# Patient Record
Sex: Male | Born: 1990 | Race: Black or African American | Hispanic: No | Marital: Married | Smoking: Never smoker
Health system: Southern US, Community
[De-identification: ages and names within clinical notes are randomized; demographics above are authoritative.]

## PROBLEM LIST (undated history)

## (undated) HISTORY — PX: TONSILLECTOMY: SUR1361

---

## 2012-03-28 DIAGNOSIS — S31139A Puncture wound of abdominal wall without foreign body, unspecified quadrant without penetration into peritoneal cavity, initial encounter: Secondary | ICD-10-CM

## 2012-03-28 HISTORY — DX: Puncture wound of abdominal wall without foreign body, unspecified quadrant without penetration into peritoneal cavity, initial encounter: S31.139A

## 2012-03-28 HISTORY — PX: EXPLORATORY LAPAROTOMY: SUR591

## 2019-03-04 ENCOUNTER — Other Ambulatory Visit: Payer: Self-pay

## 2019-03-04 ENCOUNTER — Emergency Department (HOSPITAL_BASED_OUTPATIENT_CLINIC_OR_DEPARTMENT_OTHER): Payer: Worker's Compensation

## 2019-03-04 ENCOUNTER — Encounter (HOSPITAL_BASED_OUTPATIENT_CLINIC_OR_DEPARTMENT_OTHER): Payer: Self-pay

## 2019-03-04 ENCOUNTER — Emergency Department (HOSPITAL_BASED_OUTPATIENT_CLINIC_OR_DEPARTMENT_OTHER)
Admission: EM | Admit: 2019-03-04 | Discharge: 2019-03-04 | Disposition: A | Discharge status: Home / Self Care | Payer: Worker's Compensation | Attending: Emergency Medicine | Admitting: Emergency Medicine

## 2019-03-04 DIAGNOSIS — Y92410 Unspecified street and highway as the place of occurrence of the external cause: Secondary | ICD-10-CM | POA: Diagnosis not present

## 2019-03-04 DIAGNOSIS — M542 Cervicalgia: Secondary | ICD-10-CM | POA: Insufficient documentation

## 2019-03-04 DIAGNOSIS — M549 Dorsalgia, unspecified: Secondary | ICD-10-CM | POA: Diagnosis not present

## 2019-03-04 DIAGNOSIS — G44309 Post-traumatic headache, unspecified, not intractable: Secondary | ICD-10-CM | POA: Insufficient documentation

## 2019-03-04 DIAGNOSIS — Y9389 Activity, other specified: Secondary | ICD-10-CM | POA: Diagnosis not present

## 2019-03-04 DIAGNOSIS — Y990 Civilian activity done for income or pay: Secondary | ICD-10-CM | POA: Diagnosis not present

## 2019-03-04 MED ORDER — OXYCODONE-ACETAMINOPHEN 5-325 MG PO TABS
1.0000 | ORAL_TABLET | Freq: Once | ORAL | Status: AC
  Administered 2019-03-04: 1 via ORAL
  Filled 2019-03-04: qty 1

## 2019-03-04 MED ORDER — NAPROXEN 500 MG PO TABS: 500.0000 mg | ORAL_TABLET | Freq: Two times a day (BID) | ORAL | 0 refills | Status: DC

## 2019-03-04 MED ORDER — CYCLOBENZAPRINE HCL 5 MG PO TABS: 5.0000 mg | ORAL_TABLET | Freq: Two times a day (BID) | ORAL | 0 refills | Status: DC | PRN

## 2019-03-04 MED ORDER — CYCLOBENZAPRINE HCL 10 MG PO TABS
10.0000 mg | ORAL_TABLET | Freq: Once | ORAL | Status: AC
  Administered 2019-03-04: 11:00:00 10 mg via ORAL
  Filled 2019-03-04: qty 1

## 2019-03-04 MED FILL — CYCLOBENZAPRINE HCL 5 MG TA: 5 | 15 days supply | Qty: 30 | Fill #0

## 2019-03-04 NOTE — ED Notes (Signed)
Patient transported to CT 

## 2019-03-04 NOTE — Discharge Instructions (Addendum)
Recommend taking prescribed NSAID, muscle relaxer and Tylenol for pain control.  Return to ER if you develop any numbness, weakness, vision changes, other new concerning symptom.  Otherwise recommend recheck with your primary doctor later this week.  Please note the muscle relaxer, Flexeril, can make you drowsy and should not be taken while driving or operate heavy machinery.
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
## 2019-03-04 NOTE — ED Triage Notes (Signed)
Pt states that he was in [facility] Saturday, has pain with turning his head, c/o back pain and trouble lifting.

## 2019-03-07 ENCOUNTER — Ambulatory Visit: Payer: Self-pay | Admitting: Adult Health Nurse Practitioner

## 2019-03-13 ENCOUNTER — Encounter: Payer: Self-pay | Admitting: Adult Health Nurse Practitioner

## 2019-03-13 ENCOUNTER — Ambulatory Visit (INDEPENDENT_AMBULATORY_CARE_PROVIDER_SITE_OTHER): Payer: Managed Care, Other (non HMO) | Admitting: Adult Health Nurse Practitioner

## 2019-03-13 ENCOUNTER — Other Ambulatory Visit: Payer: Self-pay

## 2019-03-13 DIAGNOSIS — S060X0A Concussion without loss of consciousness, initial encounter: Secondary | ICD-10-CM | POA: Diagnosis not present

## 2019-03-13 DIAGNOSIS — S134XXA Sprain of ligaments of cervical spine, initial encounter: Secondary | ICD-10-CM

## 2019-03-13 DIAGNOSIS — S134XXS Sprain of ligaments of cervical spine, sequela: Secondary | ICD-10-CM

## 2019-03-13 DIAGNOSIS — S161XXA Strain of muscle, fascia and tendon at neck level, initial encounter: Secondary | ICD-10-CM

## 2019-03-13 DIAGNOSIS — S161XXS Strain of muscle, fascia and tendon at neck level, sequela: Secondary | ICD-10-CM

## 2019-03-13 DIAGNOSIS — S060X0S Concussion without loss of consciousness, sequela: Secondary | ICD-10-CM

## 2019-03-13 HISTORY — DX: Sprain of ligaments of cervical spine, sequela: S13.4XXS

## 2019-03-13 HISTORY — DX: Strain of muscle, fascia and tendon at neck level, sequela: S16.1XXS

## 2019-03-13 HISTORY — DX: Concussion without loss of consciousness, initial encounter: S06.0X0A

## 2019-03-13 MED ORDER — TIZANIDINE HCL 4 MG PO TABS: 4.0000 mg | ORAL_TABLET | Freq: Four times a day (QID) | ORAL | 0 refills | Status: DC | PRN

## 2019-03-13 MED ORDER — METHYLPREDNISOLONE 4 MG PO TBPK: ORAL_TABLET | ORAL | 0 refills | Status: DC

## 2019-03-13 NOTE — Patient Instructions (Signed)
° ° ° °  If you have lab work done today you will be contacted with your lab results within the next 2 weeks.  If you have not heard from us then please contact us. The fastest way to get your results is to register for My Chart. ° ° °IF you received an x-ray today, you will receive an invoice from [facility]. Please contact [facility] at [phone] with questions or concerns regarding your invoice.  ° °IF you received labwork today, you will receive an invoice from [facility]. Please contact [facility] at [phone] with questions or concerns regarding your invoice.  ° °Our billing staff will not be able to assist you with questions regarding bills from these companies. ° °You will be contacted with the lab results as soon as they are available. The fastest way to get your results is to activate your My Chart account. Instructions are located on the last page of this paperwork. If you have not heard from us regarding the results in 2 weeks, please contact this office. °  ° ° ° °
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
## 2019-04-02 ENCOUNTER — Ambulatory Visit: Payer: Managed Care, Other (non HMO) | Attending: Adult Health Nurse Practitioner | Admitting: Physical Therapy

## 2019-09-01 ENCOUNTER — Other Ambulatory Visit: Payer: Self-pay

## 2019-09-01 ENCOUNTER — Encounter (HOSPITAL_BASED_OUTPATIENT_CLINIC_OR_DEPARTMENT_OTHER): Payer: Self-pay | Admitting: Emergency Medicine

## 2019-09-01 ENCOUNTER — Emergency Department (HOSPITAL_BASED_OUTPATIENT_CLINIC_OR_DEPARTMENT_OTHER): Payer: 59

## 2019-09-01 ENCOUNTER — Emergency Department (HOSPITAL_BASED_OUTPATIENT_CLINIC_OR_DEPARTMENT_OTHER)
Admission: EM | Admit: 2019-09-01 | Discharge: 2019-09-01 | Disposition: A | Discharge status: Home / Self Care | Payer: 59 | Attending: Emergency Medicine | Admitting: Emergency Medicine

## 2019-09-01 DIAGNOSIS — Z79899 Other long term (current) drug therapy: Secondary | ICD-10-CM | POA: Insufficient documentation

## 2019-09-01 DIAGNOSIS — R1084 Generalized abdominal pain: Secondary | ICD-10-CM | POA: Insufficient documentation

## 2019-09-01 DIAGNOSIS — R112 Nausea with vomiting, unspecified: Secondary | ICD-10-CM | POA: Insufficient documentation

## 2019-09-01 DIAGNOSIS — R197 Diarrhea, unspecified: Secondary | ICD-10-CM | POA: Insufficient documentation

## 2019-09-01 LAB — COMPREHENSIVE METABOLIC PANEL
ALT: 29 U/L (ref 0–44)
AST: 29 U/L (ref 15–41)
Albumin: 4.6 g/dL (ref 3.5–5.0)
Alkaline Phosphatase: 75 U/L (ref 38–126)
Anion gap: 11 (ref 5–15)
BUN: 13 mg/dL (ref 6–20)
CO2: 25 mmol/L (ref 22–32)
Calcium: 9 mg/dL (ref 8.9–10.3)
Chloride: 101 mmol/L (ref 98–111)
Creatinine, Ser: 0.95 mg/dL (ref 0.61–1.24)
GFR calc Af Amer: >60 mL/min (ref >60)
GFR calc non Af Amer: >60 mL/min (ref >60)
Glucose, Bld: 105 mg/dL — ABNORMAL HIGH (ref 70–99)
Potassium: 3.6 mmol/L (ref 3.5–5.1)
Sodium: 137 mmol/L (ref 135–145)
Total Bilirubin: 1.6 mg/dL — ABNORMAL HIGH (ref 0.3–1.2)
Total Protein: 8.7 g/dL — ABNORMAL HIGH (ref 6.5–8.1)

## 2019-09-01 LAB — CBC WITH DIFFERENTIAL/PLATELET
Abs Immature Granulocytes: 0.06 10*3/uL (ref 0.00–0.07)
Basophils Absolute: 0 10*3/uL (ref 0.0–0.1)
Basophils Relative: 0 %
Eosinophils Absolute: 0.2 10*3/uL (ref 0.0–0.5)
Eosinophils Relative: 1 %
HCT: 48.2 % (ref 39.0–52.0)
Hemoglobin: 15.7 g/dL (ref 13.0–17.0)
Immature Granulocytes: 0 %
Lymphocytes Relative: 29 %
Lymphs Abs: 4.2 10*3/uL — ABNORMAL HIGH (ref 0.7–4.0)
MCH: 26.5 pg (ref 26.0–34.0)
MCHC: 32.6 g/dL (ref 30.0–36.0)
MCV: 81.4 fL (ref 80.0–100.0)
Monocytes Absolute: 1.2 10*3/uL — ABNORMAL HIGH (ref 0.1–1.0)
Monocytes Relative: 8 %
Neutro Abs: 8.9 10*3/uL — ABNORMAL HIGH (ref 1.7–7.7)
Neutrophils Relative %: 62 %
Platelets: 399 10*3/uL (ref 150–400)
RBC: 5.92 MIL/uL — ABNORMAL HIGH (ref 4.22–5.81)
RDW: 13.3 % (ref 11.5–15.5)
WBC: 14.7 10*3/uL — ABNORMAL HIGH (ref 4.0–10.5)
nRBC: 0 % (ref 0.0–0.2)

## 2019-09-01 LAB — URINALYSIS, ROUTINE W REFLEX MICROSCOPIC
Glucose, UA: NEGATIVE mg/dL
Ketones, ur: 15 mg/dL — AB
Leukocytes,Ua: NEGATIVE
Nitrite: NEGATIVE
Protein, ur: NEGATIVE mg/dL
Specific Gravity, Urine: 1.02 (ref 1.005–1.030)
pH: 5.5 (ref 5.0–8.0)

## 2019-09-01 LAB — URINALYSIS, MICROSCOPIC (REFLEX)

## 2019-09-01 LAB — LIPASE, BLOOD: Lipase: 19 U/L (ref 11–51)

## 2019-09-01 MED ORDER — SODIUM CHLORIDE 0.9 % IV BOLUS
1000.0000 mL | Freq: Once | INTRAVENOUS | Status: AC
  Administered 2019-09-01: 1000 mL via INTRAVENOUS

## 2019-09-01 MED ORDER — ONDANSETRON HCL 4 MG/2ML IJ SOLN
4.0000 mg | Freq: Once | INTRAMUSCULAR | Status: AC
  Administered 2019-09-01: 4 mg via INTRAVENOUS
  Filled 2019-09-01: qty 2

## 2019-09-01 MED ORDER — DICYCLOMINE HCL 20 MG PO TABS: 20.0000 mg | ORAL_TABLET | Freq: Two times a day (BID) | ORAL | 0 refills | Status: DC

## 2019-09-01 MED ORDER — MORPHINE SULFATE (PF) 4 MG/ML IV SOLN
4.0000 mg | Freq: Once | INTRAVENOUS | Status: AC
  Administered 2019-09-01: 4 mg via INTRAVENOUS
  Filled 2019-09-01: qty 1

## 2019-09-01 MED ORDER — DICYCLOMINE HCL 10 MG PO CAPS
10.0000 mg | ORAL_CAPSULE | Freq: Once | ORAL | Status: AC
  Administered 2019-09-01: 10 mg via ORAL
  Filled 2019-09-01: qty 1

## 2019-09-01 MED ORDER — IOHEXOL 300 MG/ML  SOLN
100.0000 mL | Freq: Once | INTRAMUSCULAR | Status: AC | PRN
  Administered 2019-09-01: 100 mL via INTRAVENOUS

## 2019-09-01 MED ORDER — FENTANYL CITRATE (PF) 100 MCG/2ML IJ SOLN
100.0000 ug | Freq: Once | INTRAMUSCULAR | Status: AC
  Administered 2019-09-01: 100 ug via INTRAVENOUS
  Filled 2019-09-01: qty 2

## 2019-09-01 MED ORDER — ONDANSETRON 4 MG PO TBDP: 4.0000 mg | ORAL_TABLET | Freq: Three times a day (TID) | ORAL | 0 refills | Status: DC | PRN

## 2019-09-01 NOTE — Discharge Instructions (Signed)
Take Bentyl as directed.  Take Zofran for nausea.  Make sure you are staying hydrated drink plenty of fluids.  Return to the emergency department for any worsening abdominal pain, fevers, persistent vomiting, persistent blood in your vomit or any other worsening or concerning symptoms.
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
## 2019-09-01 NOTE — ED Notes (Signed)
Pt tolerated sips of water, no emesis.

## 2019-09-01 NOTE — ED Triage Notes (Signed)
Vomiting since Friday.

## 2019-09-01 NOTE — ED Notes (Signed)
PT to CT scan

## 2019-09-06 ENCOUNTER — Other Ambulatory Visit: Payer: Self-pay

## 2019-09-06 ENCOUNTER — Emergency Department (HOSPITAL_BASED_OUTPATIENT_CLINIC_OR_DEPARTMENT_OTHER)
Admission: EM | Admit: 2019-09-06 | Discharge: 2019-09-07 | Disposition: A | Discharge status: Home / Self Care | Payer: Medicaid Other | Attending: Emergency Medicine | Admitting: Emergency Medicine

## 2019-09-06 ENCOUNTER — Encounter (HOSPITAL_BASED_OUTPATIENT_CLINIC_OR_DEPARTMENT_OTHER): Payer: Self-pay

## 2019-09-06 DIAGNOSIS — R1031 Right lower quadrant pain: Secondary | ICD-10-CM | POA: Insufficient documentation

## 2019-09-06 DIAGNOSIS — R197 Diarrhea, unspecified: Secondary | ICD-10-CM | POA: Insufficient documentation

## 2019-09-06 DIAGNOSIS — R112 Nausea with vomiting, unspecified: Secondary | ICD-10-CM

## 2019-09-06 NOTE — ED Triage Notes (Addendum)
Pt c/o vomiting and diarrhea after eating at [facility]. Pt was seen on 6/6/ for same. Pt was Rx Bentyl and Zofran, but pt has had no relief today. Emesis 7x in last 24 hrs. Pt actively vomiting during triage.

## 2019-09-07 ENCOUNTER — Emergency Department (HOSPITAL_BASED_OUTPATIENT_CLINIC_OR_DEPARTMENT_OTHER): Payer: Medicaid Other

## 2019-09-07 LAB — URINALYSIS, ROUTINE W REFLEX MICROSCOPIC
Bilirubin Urine: NEGATIVE
Glucose, UA: NEGATIVE mg/dL
Ketones, ur: NEGATIVE mg/dL
Leukocytes,Ua: NEGATIVE
Nitrite: NEGATIVE
Protein, ur: NEGATIVE mg/dL
Specific Gravity, Urine: 1.02 (ref 1.005–1.030)
pH: 7 (ref 5.0–8.0)

## 2019-09-07 LAB — CBC WITH DIFFERENTIAL/PLATELET
Abs Immature Granulocytes: 0.04 10*3/uL (ref 0.00–0.07)
Basophils Absolute: 0 10*3/uL (ref 0.0–0.1)
Basophils Relative: 0 %
Eosinophils Absolute: 0.3 10*3/uL (ref 0.0–0.5)
Eosinophils Relative: 2 %
HCT: 43.6 % (ref 39.0–52.0)
Hemoglobin: 14 g/dL (ref 13.0–17.0)
Immature Granulocytes: 0 %
Lymphocytes Relative: 26 %
Lymphs Abs: 3.3 10*3/uL (ref 0.7–4.0)
MCH: 26.2 pg (ref 26.0–34.0)
MCHC: 32.1 g/dL (ref 30.0–36.0)
MCV: 81.6 fL (ref 80.0–100.0)
Monocytes Absolute: 0.7 10*3/uL (ref 0.1–1.0)
Monocytes Relative: 6 %
Neutro Abs: 8 10*3/uL — ABNORMAL HIGH (ref 1.7–7.7)
Neutrophils Relative %: 66 %
Platelets: 380 10*3/uL (ref 150–400)
RBC: 5.34 MIL/uL (ref 4.22–5.81)
RDW: 13.2 % (ref 11.5–15.5)
WBC: 12.4 10*3/uL — ABNORMAL HIGH (ref 4.0–10.5)
nRBC: 0 % (ref 0.0–0.2)

## 2019-09-07 LAB — RAPID URINE DRUG SCREEN, HOSP PERFORMED
Amphetamines: NOT DETECTED
Barbiturates: NOT DETECTED
Benzodiazepines: NOT DETECTED
Cocaine: NOT DETECTED
Opiates: NOT DETECTED
Tetrahydrocannabinol: POSITIVE — AB

## 2019-09-07 LAB — COMPREHENSIVE METABOLIC PANEL
ALT: 22 U/L (ref 0–44)
AST: 21 U/L (ref 15–41)
Albumin: 4.2 g/dL (ref 3.5–5.0)
Alkaline Phosphatase: 67 U/L (ref 38–126)
Anion gap: 10 (ref 5–15)
BUN: 12 mg/dL (ref 6–20)
CO2: 27 mmol/L (ref 22–32)
Calcium: 9 mg/dL (ref 8.9–10.3)
Chloride: 101 mmol/L (ref 98–111)
Creatinine, Ser: 1.06 mg/dL (ref 0.61–1.24)
GFR calc Af Amer: >60 mL/min (ref >60)
GFR calc non Af Amer: >60 mL/min (ref >60)
Glucose, Bld: 110 mg/dL — ABNORMAL HIGH (ref 70–99)
Potassium: 3.8 mmol/L (ref 3.5–5.1)
Sodium: 138 mmol/L (ref 135–145)
Total Bilirubin: 0.7 mg/dL (ref 0.3–1.2)
Total Protein: 7.5 g/dL (ref 6.5–8.1)

## 2019-09-07 LAB — URINALYSIS, MICROSCOPIC (REFLEX)

## 2019-09-07 LAB — LIPASE, BLOOD: Lipase: 21 U/L (ref 11–51)

## 2019-09-07 MED ORDER — PROMETHAZINE HCL 25 MG PO TABS
25.0000 mg | ORAL_TABLET | Freq: Four times a day (QID) | ORAL | 0 refills | Status: DC | PRN
Start: 2019-09-07 — End: 2021-10-03

## 2019-09-07 MED ORDER — KETOROLAC TROMETHAMINE 30 MG/ML IJ SOLN
30.0000 mg | Freq: Once | INTRAMUSCULAR | Status: AC
  Administered 2019-09-07: 30 mg via INTRAVENOUS
  Filled 2019-09-07: qty 1

## 2019-09-07 MED ORDER — SODIUM CHLORIDE 0.9 % IV BOLUS
1000.0000 mL | Freq: Once | INTRAVENOUS | Status: AC
  Administered 2019-09-07: 1000 mL via INTRAVENOUS

## 2019-09-07 MED ORDER — HALOPERIDOL LACTATE 5 MG/ML IJ SOLN
5.0000 mg | Freq: Once | INTRAMUSCULAR | Status: AC
  Administered 2019-09-07: 5 mg via INTRAVENOUS
  Filled 2019-09-07: qty 1

## 2019-09-07 MED ORDER — IOHEXOL 300 MG/ML  SOLN
100.0000 mL | Freq: Once | INTRAMUSCULAR | Status: AC | PRN
  Administered 2019-09-07: 100 mL via INTRAVENOUS

## 2019-09-07 MED ORDER — ONDANSETRON HCL 4 MG/2ML IJ SOLN
4.0000 mg | Freq: Once | INTRAMUSCULAR | Status: DC
  Filled 2019-09-07: qty 2

## 2019-09-07 NOTE — ED Notes (Signed)
  Patient given urinal and instructed to obtain urine specimen when he feels able.
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
## 2019-09-07 NOTE — Discharge Instructions (Addendum)
Begin taking Phenergan as prescribed as needed for nausea.  Clear liquid diet for the next 12 hours, then slowly advance to normal as tolerated.  Refrain from marijuana use.  Return to the ER for worsening pain, high fever, bloody stools, or other new and concerning symptoms.

## 2019-10-03 ENCOUNTER — Encounter (HOSPITAL_COMMUNITY): Payer: Self-pay

## 2019-10-03 ENCOUNTER — Other Ambulatory Visit: Payer: Self-pay

## 2019-10-03 ENCOUNTER — Emergency Department (HOSPITAL_COMMUNITY)
Admission: EM | Admit: 2019-10-03 | Discharge: 2019-10-03 | Disposition: A | Discharge status: Home / Self Care | Payer: Medicaid Other | Attending: Emergency Medicine | Admitting: Emergency Medicine

## 2019-10-03 DIAGNOSIS — R1110 Vomiting, unspecified: Secondary | ICD-10-CM | POA: Insufficient documentation

## 2019-10-03 DIAGNOSIS — Z5321 Procedure and treatment not carried out due to patient leaving prior to being seen by health care provider: Secondary | ICD-10-CM | POA: Insufficient documentation

## 2019-10-03 DIAGNOSIS — R197 Diarrhea, unspecified: Secondary | ICD-10-CM | POA: Insufficient documentation

## 2019-10-03 DIAGNOSIS — R1084 Generalized abdominal pain: Secondary | ICD-10-CM | POA: Insufficient documentation

## 2019-10-03 LAB — CBC
HCT: 43.3 % (ref 39.0–52.0)
Hemoglobin: 13.7 g/dL (ref 13.0–17.0)
MCH: 26.3 pg (ref 26.0–34.0)
MCHC: 31.6 g/dL (ref 30.0–36.0)
MCV: 83.3 fL (ref 80.0–100.0)
Platelets: 372 10*3/uL (ref 150–400)
RBC: 5.2 MIL/uL (ref 4.22–5.81)
RDW: 13.2 % (ref 11.5–15.5)
WBC: 9.7 10*3/uL (ref 4.0–10.5)
nRBC: 0 % (ref 0.0–0.2)

## 2019-10-03 LAB — COMPREHENSIVE METABOLIC PANEL WITH GFR
ALT: 27 U/L (ref 0–44)
AST: 22 U/L (ref 15–41)
Albumin: 3.9 g/dL (ref 3.5–5.0)
Alkaline Phosphatase: 71 U/L (ref 38–126)
Anion gap: 9 (ref 5–15)
BUN: 11 mg/dL (ref 6–20)
CO2: 24 mmol/L (ref 22–32)
Calcium: 9 mg/dL (ref 8.9–10.3)
Chloride: 106 mmol/L (ref 98–111)
Creatinine, Ser: 0.96 mg/dL (ref 0.61–1.24)
GFR calc Af Amer: >60 mL/min
GFR calc non Af Amer: >60 mL/min
Glucose, Bld: 136 mg/dL — ABNORMAL HIGH (ref 70–99)
Potassium: 3.8 mmol/L (ref 3.5–5.1)
Sodium: 139 mmol/L (ref 135–145)
Total Bilirubin: 0.8 mg/dL (ref 0.3–1.2)
Total Protein: 6.9 g/dL (ref 6.5–8.1)

## 2019-10-03 LAB — LIPASE, BLOOD: Lipase: 41 U/L (ref 11–51)

## 2019-10-03 MED ORDER — SODIUM CHLORIDE 0.9% FLUSH: 3.0000 mL | Freq: Once | INTRAVENOUS | Status: DC

## 2019-10-03 NOTE — ED Notes (Signed)
Called x 2 NO answer 

## 2019-10-03 NOTE — ED Triage Notes (Signed)
Patient complains of ongoing abdominal cramping with emesis and small amount of diarrhea x 2 weeks. States that he ate bad chicken. Alert and oriented, aNAD

## 2019-10-03 NOTE — ED Notes (Signed)
Called 3x for repeat vitals with no response

## 2020-11-26 IMAGING — CT CT ABD-PELV W/ CM
2 of 4 series · 17 of 46 positions shown, 19 images · IV contrast (Omnipaque)
Comparison: September 01, 2019

CLINICAL DATA: Abdominal pain

EXAM:
CT ABDOMEN AND PELVIS WITH CONTRAST
TECHNIQUE: Multidetector CT imaging of the abdomen and pelvis was performed
using the standard protocol following bolus administration of
intravenous contrast.
CONTRAST:  100mL OMNIPAQUE IOHEXOL 300 MG/ML  SOLN

[Series 2: axial st · axial · 0.88mm/px · z∈[+763,+1183]mm · 14 of 92 slices shown, 16 images]
[im 4/92  soft-tissue]
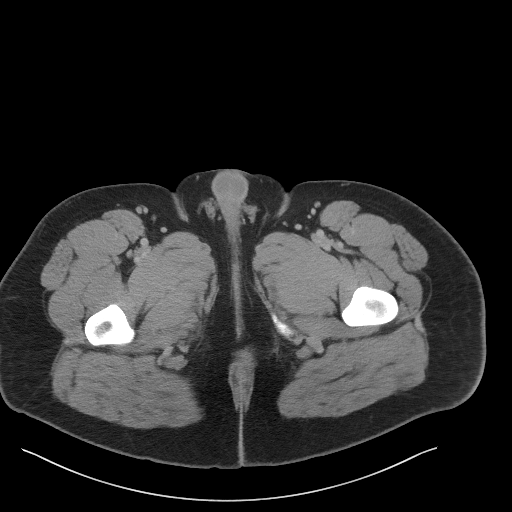
[im 4/92  bone]
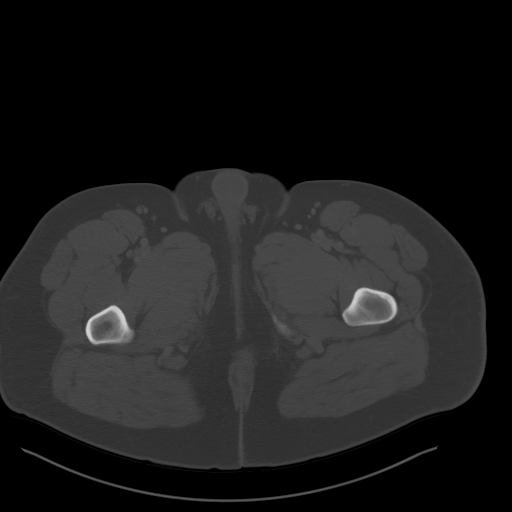
[im 12/92  soft-tissue]
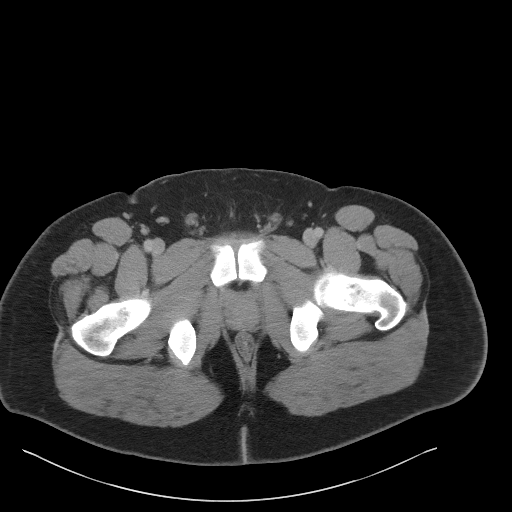
[im 19/92  soft-tissue]
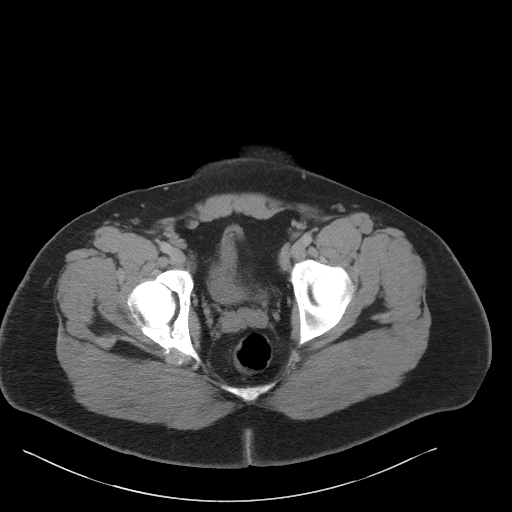
[im 23/92  soft-tissue]
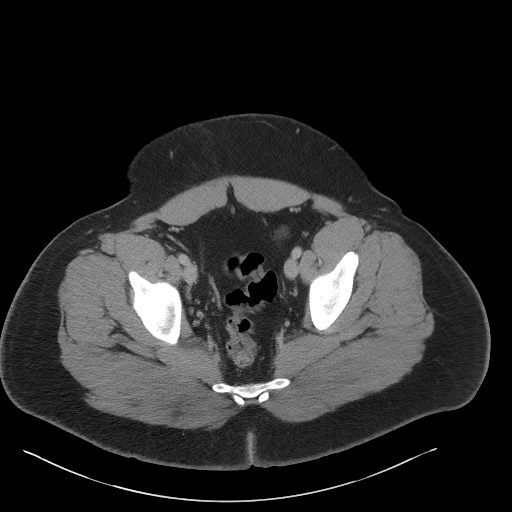
[im 31/92  soft-tissue]
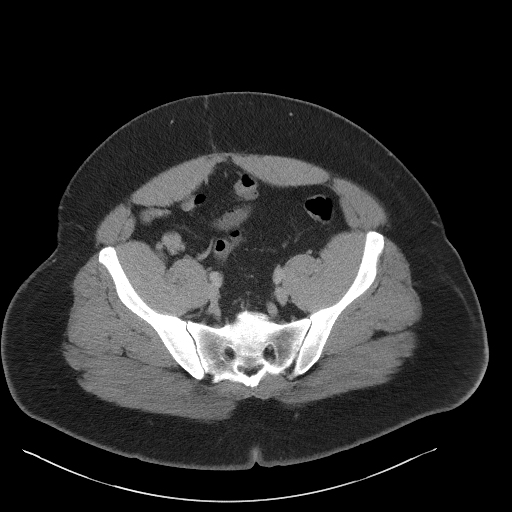
[im 38/92  soft-tissue]
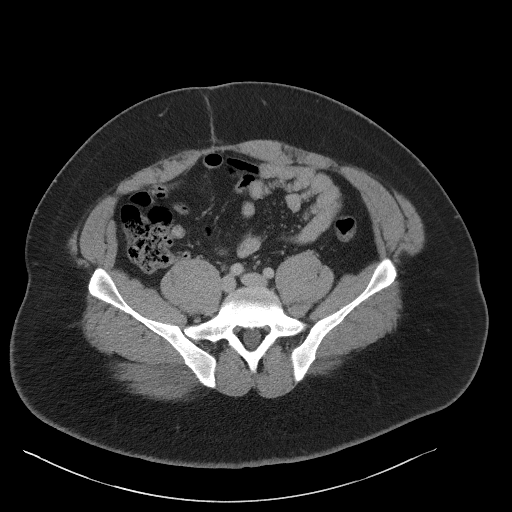
[im 42/92  soft-tissue]
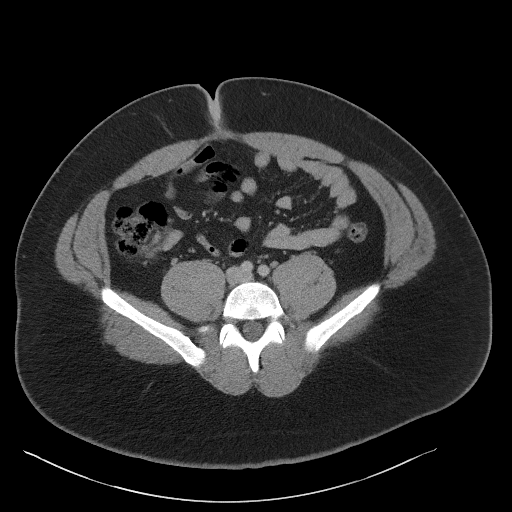
[im 50/92  soft-tissue]
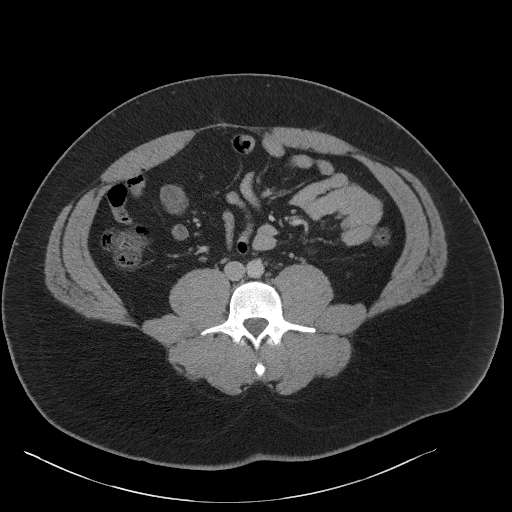
[im 54/92  soft-tissue]
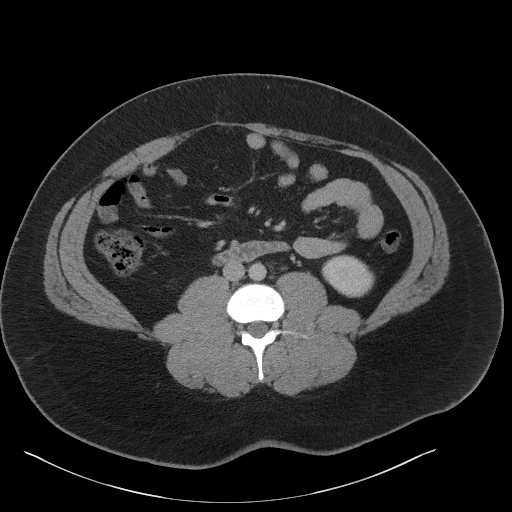
[im 54/92  bone]
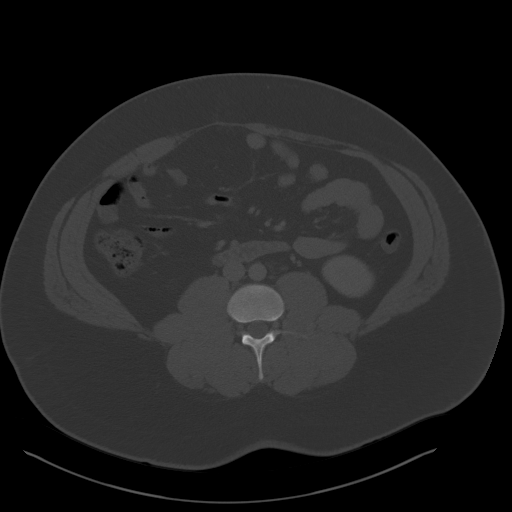
[im 61/92  soft-tissue]
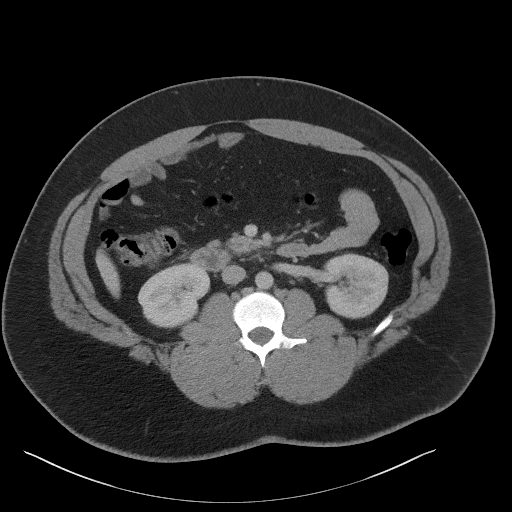
[im 69/92  soft-tissue]
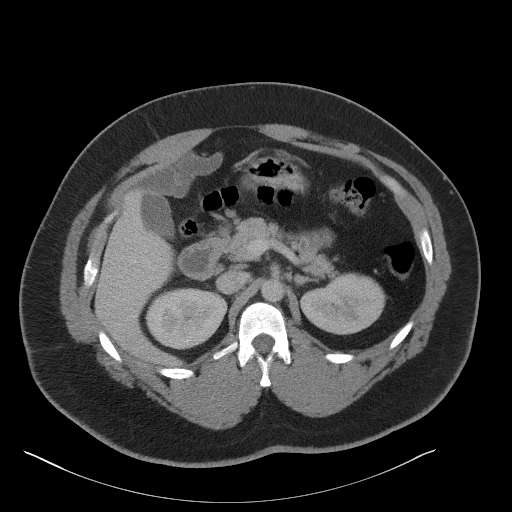
[im 73/92  soft-tissue]
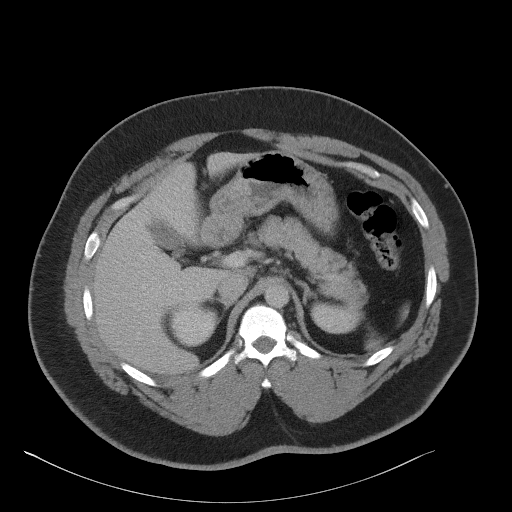
[im 80/92  soft-tissue]
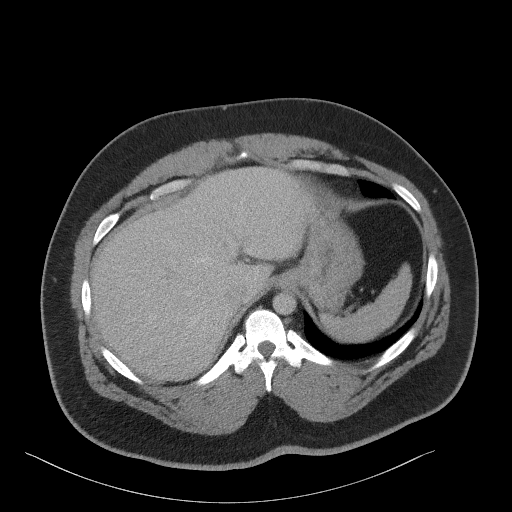
[im 88/92  soft-tissue]
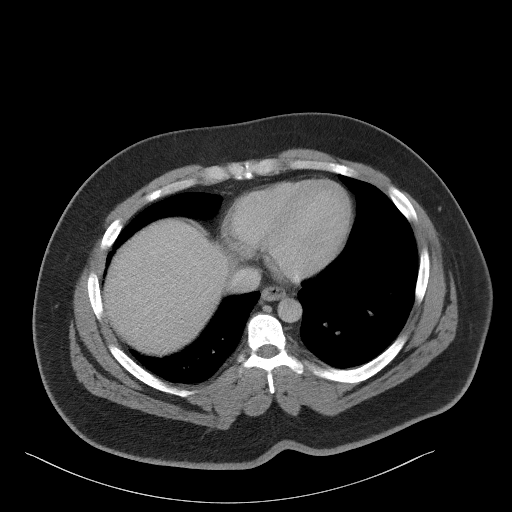

[Series 5: coronal st · coronal · 0.90mm/px · 3 of 101 slices shown]
[im 34/101  soft-tissue]
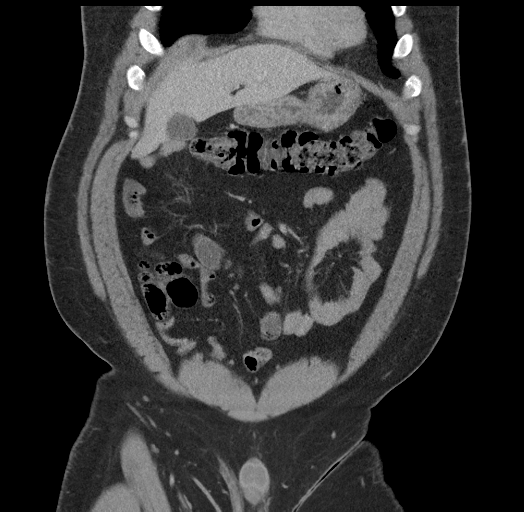
[im 45/101  soft-tissue]
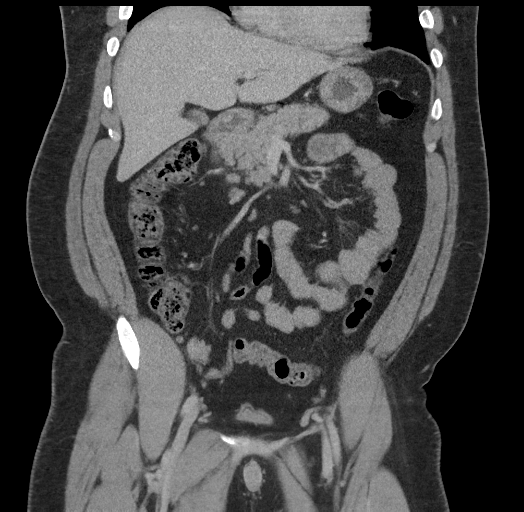
[im 56/101  soft-tissue]
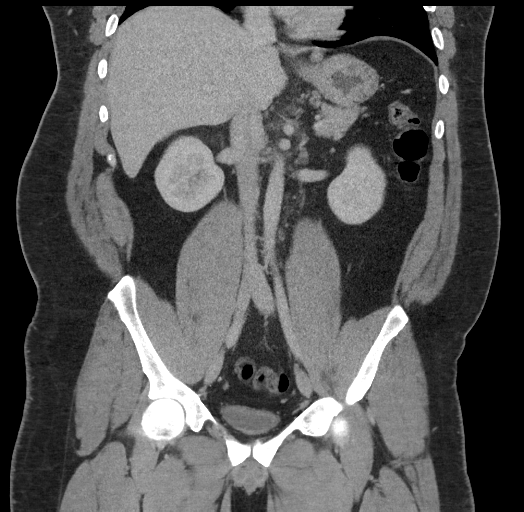

[17 of 46 positions shown; findings below may reference images not displayed]

FINDINGS: Lower chest: The lung bases are clear. The heart size is normal.

Hepatobiliary: The liver is normal. Normal gallbladder.There is no
biliary ductal dilation.

Pancreas: Normal contours without ductal dilatation. No
peripancreatic fluid collection.

Spleen: Unremarkable.

Adrenals/Urinary Tract:

--Adrenal glands: Unremarkable.

--Right kidney/ureter: No hydronephrosis or radiopaque kidney
stones.

--Left kidney/ureter: No hydronephrosis or radiopaque kidney stones.

--Urinary bladder: Unremarkable.

Stomach/Bowel:

--Stomach/Duodenum: No hiatal hernia or other gastric abnormality.
Normal duodenal course and caliber.

--Small bowel: Unremarkable.

--Colon: Unremarkable.

--Appendix: Normal.

Vascular/Lymphatic: Normal course and caliber of the major abdominal
vessels.

--No retroperitoneal lymphadenopathy.

--No mesenteric lymphadenopathy.

--No pelvic or inguinal lymphadenopathy.

Reproductive: Unremarkable

Other: No ascites or free air. The abdominal wall is normal.

Musculoskeletal. No acute displaced fractures. There may be early
mild avascular necrosis of the right femoral head. Alternatively,
this finding may be secondary to trauma as there appears to be a
defect in the posterior wall of the right acetabulum.
IMPRESSION: No acute abnormality.

## 2021-10-02 ENCOUNTER — Other Ambulatory Visit: Payer: Self-pay

## 2021-10-02 ENCOUNTER — Encounter (HOSPITAL_BASED_OUTPATIENT_CLINIC_OR_DEPARTMENT_OTHER): Payer: Self-pay | Admitting: Emergency Medicine

## 2021-10-02 ENCOUNTER — Emergency Department (HOSPITAL_BASED_OUTPATIENT_CLINIC_OR_DEPARTMENT_OTHER)
Admission: EM | Admit: 2021-10-02 | Discharge: 2021-10-03 | Disposition: A | Discharge status: Home / Self Care | Payer: Self-pay | Attending: Emergency Medicine | Admitting: Emergency Medicine

## 2021-10-02 DIAGNOSIS — R059 Cough, unspecified: Secondary | ICD-10-CM | POA: Insufficient documentation

## 2021-10-02 DIAGNOSIS — K529 Noninfective gastroenteritis and colitis, unspecified: Secondary | ICD-10-CM | POA: Insufficient documentation

## 2021-10-02 DIAGNOSIS — K8020 Calculus of gallbladder without cholecystitis without obstruction: Secondary | ICD-10-CM | POA: Insufficient documentation

## 2021-10-02 LAB — COMPREHENSIVE METABOLIC PANEL
ALT: 22 U/L (ref 0–44)
AST: 20 U/L (ref 15–41)
Albumin: 3.9 g/dL (ref 3.5–5.0)
Alkaline Phosphatase: 72 U/L (ref 38–126)
Anion gap: 8 (ref 5–15)
BUN: 8 mg/dL (ref 6–20)
CO2: 26 mmol/L (ref 22–32)
Calcium: 9 mg/dL (ref 8.9–10.3)
Chloride: 103 mmol/L (ref 98–111)
Creatinine, Ser: 0.92 mg/dL (ref 0.61–1.24)
GFR, Estimated: >60 mL/min (ref >60)
Glucose, Bld: 111 mg/dL — ABNORMAL HIGH (ref 70–99)
Potassium: 3.4 mmol/L — ABNORMAL LOW (ref 3.5–5.1)
Sodium: 137 mmol/L (ref 135–145)
Total Bilirubin: 1.2 mg/dL (ref 0.3–1.2)
Total Protein: 8.1 g/dL (ref 6.5–8.1)

## 2021-10-02 LAB — URINALYSIS, ROUTINE W REFLEX MICROSCOPIC
Bilirubin Urine: NEGATIVE
Glucose, UA: NEGATIVE mg/dL
Hgb urine dipstick: NEGATIVE
Ketones, ur: NEGATIVE mg/dL
Leukocytes,Ua: NEGATIVE
Nitrite: NEGATIVE
Protein, ur: NEGATIVE mg/dL
Specific Gravity, Urine: 1.01 (ref 1.005–1.030)
pH: 7 (ref 5.0–8.0)

## 2021-10-02 LAB — CBC
HCT: 43.2 % (ref 39.0–52.0)
Hemoglobin: 14.6 g/dL (ref 13.0–17.0)
MCH: 26.9 pg (ref 26.0–34.0)
MCHC: 33.8 g/dL (ref 30.0–36.0)
MCV: 79.7 fL — ABNORMAL LOW (ref 80.0–100.0)
Platelets: 393 10*3/uL (ref 150–400)
RBC: 5.42 MIL/uL (ref 4.22–5.81)
RDW: 13.1 % (ref 11.5–15.5)
WBC: 13.9 10*3/uL — ABNORMAL HIGH (ref 4.0–10.5)
nRBC: 0 % (ref 0.0–0.2)

## 2021-10-02 LAB — LIPASE, BLOOD: Lipase: 22 U/L (ref 11–51)

## 2021-10-02 NOTE — ED Triage Notes (Signed)
Reports generalized abdominal pain, cough, n/v/d for the last few days.  Taking otc cold medicine at home.

## 2021-10-03 ENCOUNTER — Emergency Department (HOSPITAL_BASED_OUTPATIENT_CLINIC_OR_DEPARTMENT_OTHER): Payer: Self-pay

## 2021-10-03 ENCOUNTER — Encounter (HOSPITAL_BASED_OUTPATIENT_CLINIC_OR_DEPARTMENT_OTHER): Payer: Self-pay | Admitting: Emergency Medicine

## 2021-10-03 LAB — DIFFERENTIAL
Abs Immature Granulocytes: 0.05 10*3/uL (ref 0.00–0.07)
Basophils Absolute: 0 10*3/uL (ref 0.0–0.1)
Basophils Relative: 0 %
Eosinophils Absolute: 0.2 10*3/uL (ref 0.0–0.5)
Eosinophils Relative: 1 %
Immature Granulocytes: 0 %
Lymphocytes Relative: 23 %
Lymphs Abs: 3.2 10*3/uL (ref 0.7–4.0)
Monocytes Absolute: 1.4 10*3/uL — ABNORMAL HIGH (ref 0.1–1.0)
Monocytes Relative: 10 %
Neutro Abs: 9.2 10*3/uL — ABNORMAL HIGH (ref 1.7–7.7)
Neutrophils Relative %: 66 %

## 2021-10-03 MED ORDER — DICYCLOMINE HCL 20 MG PO TABS: 20.0000 mg | ORAL_TABLET | Freq: Four times a day (QID) | ORAL | 0 refills | Status: DC | PRN

## 2021-10-03 MED ORDER — ONDANSETRON 8 MG PO TBDP: 8.0000 mg | ORAL_TABLET | Freq: Three times a day (TID) | ORAL | 0 refills | Status: DC | PRN

## 2021-10-03 MED ORDER — DICYCLOMINE HCL 10 MG PO CAPS
20.0000 mg | ORAL_CAPSULE | Freq: Once | ORAL | Status: AC
  Administered 2021-10-03: 20 mg via ORAL
  Filled 2021-10-03: qty 2

## 2021-10-03 MED ORDER — SODIUM CHLORIDE 0.9 % IV BOLUS
1000.0000 mL | Freq: Once | INTRAVENOUS | Status: AC
Start: 2021-10-03 — End: 2021-10-03
  Administered 2021-10-03: 1000 mL via INTRAVENOUS

## 2021-10-03 MED ORDER — IOHEXOL 300 MG/ML  SOLN
100.0000 mL | Freq: Once | INTRAMUSCULAR | Status: AC | PRN
  Administered 2021-10-03: 100 mL via INTRAVENOUS

## 2021-10-03 MED ORDER — FENTANYL CITRATE PF 50 MCG/ML IJ SOSY
100.0000 ug | PREFILLED_SYRINGE | Freq: Once | INTRAMUSCULAR | Status: AC
  Administered 2021-10-03: 100 ug via INTRAVENOUS
  Filled 2021-10-03: qty 2

## 2021-10-03 MED ORDER — ONDANSETRON HCL 4 MG/2ML IJ SOLN
4.0000 mg | Freq: Once | INTRAMUSCULAR | Status: AC
  Administered 2021-10-03: 4 mg via INTRAVENOUS
  Filled 2021-10-03: qty 2

## 2021-10-03 MED ORDER — SODIUM CHLORIDE 0.9 % IV BOLUS
1000.0000 mL | Freq: Once | INTRAVENOUS | Status: AC
  Administered 2021-10-03: 1000 mL via INTRAVENOUS

## 2021-10-03 NOTE — ED Provider Notes (Incomplete)
[facility] Provider Note: Lowella Dell, MD, FACEP  CSN: [id number] MRN: [id number] ARRIVAL: 10/02/21 at 2251 ROOM: MH01/MH01   CHIEF COMPLAINT  Abdominal Pain   HISTORY OF PRESENT ILLNESS  10/03/21 12:03 AM Gregory Myers is a 31 y.o. male with generalized abdominal pain, cough, nausea, vomiting and diarrhea for the past several days.   History reviewed. No pertinent past medical history.  Past Surgical History:  Procedure Laterality Date  . TONSILLECTOMY      No family history on file.  Social History   Tobacco Use  . Smoking status: Never  . Smokeless tobacco: Never  Vaping Use  . Vaping Use: Never used  Substance Use Topics  . Alcohol use: Yes    Comment: social  . Drug use: Never    Prior to Admission medications   Medication Sig Start Date End Date Taking? Authorizing Provider  dicyclomine (BENTYL) 20 MG tablet Take 1 tablet (20 mg total) by mouth 2 (two) times daily. 09/01/19   Maxwell Caul, PA-C  methylPREDNISolone (MEDROL DOSEPAK) 4 MG TBPK tablet As directed on pkg label 03/13/19   Royal Hawthorn, NP  ondansetron (ZOFRAN ODT) 4 MG disintegrating tablet Take 1 tablet (4 mg total) by mouth every 8 (eight) hours as needed for nausea or vomiting. 09/01/19   Maxwell Caul, PA-C  promethazine (PHENERGAN) 25 MG tablet Take 1 tablet (25 mg total) by mouth every 6 (six) hours as needed for nausea. 09/07/19   Geoffery Lyons, MD  tiZANidine (ZANAFLEX) 4 MG tablet Take 1 tablet (4 mg total) by mouth every 6 (six) hours as needed for muscle spasms. 03/13/19   Royal Hawthorn, NP    Allergies Patient has no known allergies.   REVIEW OF SYSTEMS  Negative except as noted here or in the History of Present Illness.   PHYSICAL EXAMINATION  Initial Vital Signs Blood pressure 116/80, pulse 87, temperature 99.5 F (37.5 C), temperature source Oral, resp. rate 20, height 5\' 5"  (1.651 m), weight 108.9 kg, SpO2 99 %.  Examination General:  Well-developed, well-nourished male in no acute distress; appearance consistent with age of record HENT: normocephalic; atraumatic Eyes: pupils equal, round and reactive to light; extraocular muscles intact Neck: supple Heart: regular rate and rhythm; no murmurs, rubs or gallops Lungs: clear to auscultation bilaterally Abdomen: soft; nondistended; nontender; no masses or hepatosplenomegaly; bowel sounds present Extremities: No deformity; full range of motion; pulses normal Neurologic: Awake, alert and oriented; motor function intact in all extremities and symmetric; no facial droop Skin: Warm and dry Psychiatric: Normal mood and affect   RESULTS  Summary of this visit's results, reviewed and interpreted by myself:   EKG Interpretation  Date/Time:    Ventricular Rate:    PR Interval:    QRS Duration:   QT Interval:    QTC Calculation:   R Axis:     Text Interpretation:         Laboratory Studies: Results for orders placed or performed during the hospital encounter of 10/02/21 (from the past 24 hour(s))  Lipase, blood     Status: None   Collection Time: 10/02/21 11:20 PM  Result Value Ref Range   Lipase 22 11 - 51 U/L  Comprehensive metabolic panel     Status: Abnormal   Collection Time: 10/02/21 11:20 PM  Result Value Ref Range   Sodium 137 135 - 145 mmol/L   Potassium 3.4 (L) 3.5 - 5.1 mmol/L   Chloride 103 98 - 111  mmol/L   CO2 26 22 - 32 mmol/L   Glucose, Bld 111 (H) 70 - 99 mg/dL   BUN 8 6 - 20 mg/dL   Creatinine, Ser 6.31 0.61 - 1.24 mg/dL   Calcium 9.0 8.9 - 49.7 mg/dL   Total Protein 8.1 6.5 - 8.1 g/dL   Albumin 3.9 3.5 - 5.0 g/dL   AST 20 15 - 41 U/L   ALT 22 0 - 44 U/L   Alkaline Phosphatase 72 38 - 126 U/L   Total Bilirubin 1.2 0.3 - 1.2 mg/dL   GFR, Estimated >02 >63 mL/min   Anion gap 8 5 - 15  CBC     Status: Abnormal   Collection Time: 10/02/21 11:20 PM  Result Value Ref Range   WBC 13.9 (H) 4.0 - 10.5 K/uL   RBC 5.42 4.22 - 5.81 MIL/uL    Hemoglobin 14.6 13.0 - 17.0 g/dL   HCT 78.5 88.5 - 02.7 %   MCV 79.7 (L) 80.0 - 100.0 fL   MCH 26.9 26.0 - 34.0 pg   MCHC 33.8 30.0 - 36.0 g/dL   RDW 74.1 28.7 - 86.7 %   Platelets 393 150 - 400 K/uL   nRBC 0.0 0.0 - 0.2 %  Urinalysis, Routine w reflex microscopic     Status: None   Collection Time: 10/02/21 11:20 PM  Result Value Ref Range   Color, Urine YELLOW YELLOW   APPearance CLEAR CLEAR   Specific Gravity, Urine 1.010 1.005 - 1.030   pH 7.0 5.0 - 8.0   Glucose, UA NEGATIVE NEGATIVE mg/dL   Hgb urine dipstick NEGATIVE NEGATIVE   Bilirubin Urine NEGATIVE NEGATIVE   Ketones, ur NEGATIVE NEGATIVE mg/dL   Protein, ur NEGATIVE NEGATIVE mg/dL   Nitrite NEGATIVE NEGATIVE   Leukocytes,Ua NEGATIVE NEGATIVE   Imaging Studies: No results found.  ED COURSE and MDM  Nursing notes, initial and subsequent vitals signs, including pulse oximetry, reviewed and interpreted by myself.  Vitals:   10/02/21 2317 10/02/21 2320  BP:  116/80  Pulse:  87  Resp:  20  Temp:  99.5 F (37.5 C)  TempSrc:  Oral  SpO2:  99%  Weight: 108.9 kg   Height: 5\' 5"  (1.651 m)    Medications - No data to display    PROCEDURES  Procedures   ED DIAGNOSES  No diagnosis found.
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
## 2023-04-16 ENCOUNTER — Emergency Department (HOSPITAL_BASED_OUTPATIENT_CLINIC_OR_DEPARTMENT_OTHER): Payer: BC Managed Care – PPO

## 2023-04-16 ENCOUNTER — Encounter (HOSPITAL_BASED_OUTPATIENT_CLINIC_OR_DEPARTMENT_OTHER): Payer: Self-pay

## 2023-04-16 ENCOUNTER — Inpatient Hospital Stay (HOSPITAL_BASED_OUTPATIENT_CLINIC_OR_DEPARTMENT_OTHER)
Admission: EM | Admit: 2023-04-16 | Discharge: 2023-04-22 | DRG: 194 | Disposition: A | Discharge status: Home / Self Care | Payer: BC Managed Care – PPO | Attending: Internal Medicine | Admitting: Internal Medicine

## 2023-04-16 ENCOUNTER — Other Ambulatory Visit: Payer: Self-pay

## 2023-04-16 DIAGNOSIS — K219 Gastro-esophageal reflux disease without esophagitis: Secondary | ICD-10-CM

## 2023-04-16 DIAGNOSIS — Z6841 Body Mass Index (BMI) 40.0 and over, adult: Secondary | ICD-10-CM

## 2023-04-16 DIAGNOSIS — Z79899 Other long term (current) drug therapy: Secondary | ICD-10-CM

## 2023-04-16 DIAGNOSIS — J101 Influenza due to other identified influenza virus with other respiratory manifestations: Secondary | ICD-10-CM | POA: Diagnosis not present

## 2023-04-16 DIAGNOSIS — E876 Hypokalemia: Secondary | ICD-10-CM | POA: Diagnosis present

## 2023-04-16 DIAGNOSIS — R000 Tachycardia, unspecified: Secondary | ICD-10-CM

## 2023-04-16 DIAGNOSIS — Z1152 Encounter for screening for COVID-19: Secondary | ICD-10-CM

## 2023-04-16 DIAGNOSIS — Z91014 Allergy to mammalian meats: Secondary | ICD-10-CM

## 2023-04-16 DIAGNOSIS — J111 Influenza due to unidentified influenza virus with other respiratory manifestations: Principal | ICD-10-CM

## 2023-04-16 DIAGNOSIS — K8020 Calculus of gallbladder without cholecystitis without obstruction: Secondary | ICD-10-CM | POA: Diagnosis present

## 2023-04-16 DIAGNOSIS — R0682 Tachypnea, not elsewhere classified: Secondary | ICD-10-CM

## 2023-04-16 DIAGNOSIS — Z880 Allergy status to penicillin: Secondary | ICD-10-CM

## 2023-04-16 LAB — URINALYSIS, ROUTINE W REFLEX MICROSCOPIC
Bilirubin Urine: NEGATIVE
Glucose, UA: NEGATIVE mg/dL
Ketones, ur: NEGATIVE mg/dL
Leukocytes,Ua: NEGATIVE
Nitrite: NEGATIVE
Protein, ur: 30 mg/dL — AB
Specific Gravity, Urine: >=1.03 (ref 1.005–1.030)
pH: 5.5 (ref 5.0–8.0)

## 2023-04-16 LAB — CBC
HCT: 46.2 % (ref 39.0–52.0)
Hemoglobin: 15.2 g/dL (ref 13.0–17.0)
MCH: 26.7 pg (ref 26.0–34.0)
MCHC: 32.9 g/dL (ref 30.0–36.0)
MCV: 81.2 fL (ref 80.0–100.0)
Platelets: 325 10*3/uL (ref 150–400)
RBC: 5.69 MIL/uL (ref 4.22–5.81)
RDW: 13.6 % (ref 11.5–15.5)
WBC: 10.1 10*3/uL (ref 4.0–10.5)
nRBC: 0 % (ref 0.0–0.2)

## 2023-04-16 LAB — RESP PANEL BY RT-PCR (RSV, FLU A&B, COVID)  RVPGX2
Influenza A by PCR: POSITIVE — AB
Influenza B by PCR: NEGATIVE
Resp Syncytial Virus by PCR: NEGATIVE
SARS Coronavirus 2 by RT PCR: NEGATIVE

## 2023-04-16 LAB — URINALYSIS, MICROSCOPIC (REFLEX)

## 2023-04-16 LAB — COMPREHENSIVE METABOLIC PANEL
ALT: 38 U/L (ref 0–44)
AST: 32 U/L (ref 15–41)
Albumin: 4.3 g/dL (ref 3.5–5.0)
Alkaline Phosphatase: 72 U/L (ref 38–126)
Anion gap: 11 (ref 5–15)
BUN: 9 mg/dL (ref 6–20)
CO2: 22 mmol/L (ref 22–32)
Calcium: 9.3 mg/dL (ref 8.9–10.3)
Chloride: 103 mmol/L (ref 98–111)
Creatinine, Ser: 1 mg/dL (ref 0.61–1.24)
GFR, Estimated: >60 mL/min (ref >60)
Glucose, Bld: 108 mg/dL — ABNORMAL HIGH (ref 70–99)
Potassium: 3.9 mmol/L (ref 3.5–5.1)
Sodium: 136 mmol/L (ref 135–145)
Total Bilirubin: 0.9 mg/dL (ref 0.0–1.2)
Total Protein: 8 g/dL (ref 6.5–8.1)

## 2023-04-16 LAB — LIPASE, BLOOD: Lipase: 23 U/L (ref 11–51)

## 2023-04-16 LAB — CK: Total CK: 296 U/L (ref 49–397)

## 2023-04-16 LAB — D-DIMER, QUANTITATIVE: D-Dimer, Quant: <0.27 ug{FEU}/mL (ref 0.00–0.50)

## 2023-04-16 MED ORDER — SODIUM CHLORIDE 0.9 % IV BOLUS
1000.0000 mL | Freq: Once | INTRAVENOUS | Status: AC
  Administered 2023-04-16: 1000 mL via INTRAVENOUS

## 2023-04-16 MED ORDER — OSELTAMIVIR PHOSPHATE 75 MG PO CAPS: 75.0000 mg | ORAL_CAPSULE | Freq: Two times a day (BID) | ORAL | 0 refills | Status: DC

## 2023-04-16 MED ORDER — ONDANSETRON HCL 4 MG PO TABS: 4.0000 mg | ORAL_TABLET | Freq: Three times a day (TID) | ORAL | 0 refills | Status: AC | PRN

## 2023-04-16 MED ORDER — ACETAMINOPHEN 325 MG PO TABS
650.0000 mg | ORAL_TABLET | Freq: Once | ORAL | Status: AC
  Administered 2023-04-16: 650 mg via ORAL
  Filled 2023-04-16: qty 2

## 2023-04-16 MED ORDER — IOHEXOL 350 MG/ML SOLN
100.0000 mL | Freq: Once | INTRAVENOUS | Status: AC | PRN
  Administered 2023-04-17: 100 mL via INTRAVENOUS

## 2023-04-16 MED ORDER — ONDANSETRON HCL 4 MG/2ML IJ SOLN
4.0000 mg | Freq: Once | INTRAMUSCULAR | Status: AC
  Administered 2023-04-16: 4 mg via INTRAVENOUS
  Filled 2023-04-16: qty 2

## 2023-04-16 MED ORDER — KETOROLAC TROMETHAMINE 30 MG/ML IJ SOLN
30.0000 mg | Freq: Once | INTRAMUSCULAR | Status: AC
  Administered 2023-04-16: 30 mg via INTRAVENOUS
  Filled 2023-04-16: qty 1

## 2023-04-16 MED ORDER — SODIUM CHLORIDE 0.9 % IV BOLUS
1000.0000 mL | Freq: Once | INTRAVENOUS | Status: AC
Start: 2023-04-16 — End: 2023-04-17
  Administered 2023-04-16: 1000 mL via INTRAVENOUS

## 2023-04-16 NOTE — ED Notes (Signed)
Pt knows we need urine

## 2023-04-16 NOTE — ED Notes (Signed)
Dr. Charm Barges notified of VS; order received

## 2023-04-16 NOTE — ED Notes (Signed)
Asked pt for urine sample, he has cup with him.  He states that he has been unable to go

## 2023-04-16 NOTE — ED Provider Notes (Signed)
[place] EM[facility] DEPARTMENT AT [facility] Provider Note   CSN: [id number] Arrival date & time: 04/16/23  1721     History {Add pertinent medical, surgical, social history, OB history to HPI:1} Chief Complaint  Patient presents with   Cough   Vomiting    Gregory Myers is a 33 y.o. male.  He has no significant past medical history.  He said he has been sick since yesterday with fever body aches cough nausea vomiting diarrhea.  He has tried nothing for it.  No sick contacts that he is aware of.  The history is provided by the patient.  Influenza Presenting symptoms: cough, diarrhea, fatigue, fever, headache, myalgias, nausea and vomiting   Cough:    Cough characteristics:  Non-productive   Sputum characteristics:  Nondescript   Severity:  Moderate   Onset quality:  Gradual   Duration:  2 days   Progression:  Unchanged   Chronicity:  New Risk factors: no immunocompromised state and no sick contacts        Home Medications Prior to Admission medications   Medication Sig Start Date End Date Taking? Authorizing Provider  dicyclomine (BENTYL) 20 MG tablet Take 1 tablet (20 mg total) by mouth every 6 (six) hours as needed (Abdominal cramping). 10/03/21   Molpus, John, MD  ondansetron (ZOFRAN-ODT) 8 MG disintegrating tablet Take 1 tablet (8 mg total) by mouth every 8 (eight) hours as needed for nausea or vomiting. 10/03/21   Molpus, John, MD      Allergies    Amoxicillin    Review of Systems   Review of Systems  Constitutional:  Positive for fatigue and fever.  Respiratory:  Positive for cough.   Cardiovascular:  Negative for chest pain.  Gastrointestinal:  Positive for diarrhea, nausea and vomiting.  Genitourinary:  Negative for dysuria.  Musculoskeletal:  Positive for myalgias.  Neurological:  Positive for headaches.    Physical Exam Updated Vital Signs BP 130/81 (BP Location: Left Arm)   Pulse (!) 110   Temp 98.3 F (36.8 C) (Oral)   Resp (!) 22   Ht  5\' 6"  (1.676 m)   Wt 117.9 kg   SpO2 98%   BMI 41.97 kg/m  Physical Exam Vitals and nursing note reviewed.  Constitutional:      General: He is not in acute distress.    Appearance: Normal appearance. He is well-developed.  HENT:     Head: Normocephalic and atraumatic.  Eyes:     Conjunctiva/sclera: Conjunctivae normal.  Cardiovascular:     Rate and Rhythm: Regular rhythm. Tachycardia present.     Heart sounds: No murmur heard. Pulmonary:     Effort: Pulmonary effort is normal. No respiratory distress.     Breath sounds: Normal breath sounds.  Abdominal:     Palpations: Abdomen is soft.     Tenderness: There is no abdominal tenderness. There is no guarding or rebound.  Musculoskeletal:        General: No deformity.     Cervical back: Neck supple.  Skin:    General: Skin is warm and dry.     Capillary Refill: Capillary refill takes less than 2 seconds.  Neurological:     General: No focal deficit present.     Mental Status: He is alert.     ED Results / Procedures / Treatments   Labs (all labs ordered are listed, but only abnormal results are displayed) Labs Reviewed  RESP PANEL BY RT-PCR (RSV, FLU A&B, COVID)  RVPGX2 -  Abnormal; Notable for the following components:      Result Value   Influenza A by PCR POSITIVE (*)    All other components within normal limits  COMPREHENSIVE METABOLIC PANEL - Abnormal; Notable for the following components:   Glucose, Bld 108 (*)    All other components within normal limits  LIPASE, BLOOD  CBC  URINALYSIS, ROUTINE W REFLEX MICROSCOPIC    EKG None  Radiology No results found.  Procedures Procedures  {Document cardiac monitor, telemetry assessment procedure when appropriate:1}  Medications Ordered in ED Medications  sodium chloride 0.9 % bolus 1,000 mL (has no administration in time range)  ondansetron (ZOFRAN) injection 4 mg (has no administration in time range)  ketorolac (TORADOL) 30 MG/ML injection 30 mg (has no  administration in time range)    ED Course/ Medical Decision Making/ A&P   {   Click here for ABCD2, HEART and other calculatorsREFRESH Note before signing :1}                              Medical Decision Making Amount and/or Complexity of Data Reviewed Labs: ordered.  Risk Prescription drug management.   This patient complains of ***; this involves an extensive number of treatment Options and is a complaint that carries with it a high risk of complications and morbidity. The differential includes ***  I ordered, reviewed and interpreted labs, which included *** I ordered medication *** and reviewed PMP when indicated. I ordered imaging studies which included *** and I independently    visualized and interpreted imaging which showed *** Additional history obtained from *** Previous records obtained and reviewed *** I consulted *** and discussed lab and imaging findings and discussed disposition.  Cardiac monitoring reviewed, *** Social determinants considered, *** Critical Interventions: ***  After the interventions stated above, I reevaluated the patient and found *** Admission and further testing considered, ***   {Document critical care time when appropriate:1} {Document review of labs and clinical decision tools ie heart score, Chads2Vasc2 etc:1}  {Document your independent review of radiology images, and any outside records:1} {Document your discussion with family members, caretakers, and with consultants:1} {Document social determinants of health affecting pt's care:1} {Document your decision making why or why not admission, treatments were needed:1} Final Clinical Impression(s) / ED Diagnoses Final diagnoses:  None    Rx / DC Orders ED Discharge Orders     None

## 2023-04-16 NOTE — ED Notes (Signed)
Asked pt for urine sample again, pt reports that he can not void, po intake encouraged

## 2023-04-16 NOTE — ED Notes (Signed)
RT Brett Canales at bedside to ambulate patient.

## 2023-04-16 NOTE — ED Provider Notes (Signed)
Care assumed from Dr. Charm Barges.  Patient with a 2-day history of nausea, vomiting, diarrhea, cough, fever.  Found to be flu positive.  Remains tachycardic and tachypneic.  Receiving additional IV fluids.  Chest x-ray is negative.  On attempted ambulation, patient became tachycardic to the 140s and tachypneic to 35-40.  No hypoxia.  CT negative for pulmonary embolism or pneumonia.  Does show gallstones on CT abdomen pelvis but no evidence of cholecystitis.  No significant right upper quadrant pain to palpation.  Patient remains tachycardic to the 120s despite 2 L of IV fluids.  Remains tachypneic, 35-40 range.  No hypoxia.  Given persistent tachycardia and tachypnea we will plan observation admission for hydration overnight. Patient agreeable.  Discussed with Dr. Loney Loh.   Gregory Octave, MD 04/17/23 0130

## 2023-04-16 NOTE — ED Triage Notes (Addendum)
C/o fever, nausea, cough, cramping on sides, diarrhea since yesterday. Vomiting, unable to tolerate PO

## 2023-04-17 DIAGNOSIS — Z880 Allergy status to penicillin: Secondary | ICD-10-CM | POA: Diagnosis not present

## 2023-04-17 DIAGNOSIS — Z1152 Encounter for screening for COVID-19: Secondary | ICD-10-CM | POA: Diagnosis not present

## 2023-04-17 DIAGNOSIS — K8020 Calculus of gallbladder without cholecystitis without obstruction: Secondary | ICD-10-CM | POA: Diagnosis not present

## 2023-04-17 DIAGNOSIS — Z6841 Body Mass Index (BMI) 40.0 and over, adult: Secondary | ICD-10-CM | POA: Diagnosis not present

## 2023-04-17 DIAGNOSIS — R0682 Tachypnea, not elsewhere classified: Secondary | ICD-10-CM | POA: Diagnosis present

## 2023-04-17 DIAGNOSIS — J101 Influenza due to other identified influenza virus with other respiratory manifestations: Principal | ICD-10-CM

## 2023-04-17 DIAGNOSIS — Z79899 Other long term (current) drug therapy: Secondary | ICD-10-CM | POA: Diagnosis not present

## 2023-04-17 DIAGNOSIS — E876 Hypokalemia: Secondary | ICD-10-CM | POA: Diagnosis not present

## 2023-04-17 DIAGNOSIS — Z91014 Allergy to mammalian meats: Secondary | ICD-10-CM | POA: Diagnosis not present

## 2023-04-17 LAB — RAPID URINE DRUG SCREEN, HOSP PERFORMED
Amphetamines: NOT DETECTED
Barbiturates: NOT DETECTED
Benzodiazepines: NOT DETECTED
Cocaine: NOT DETECTED
Opiates: NOT DETECTED
Tetrahydrocannabinol: POSITIVE — AB

## 2023-04-17 LAB — TROPONIN I (HIGH SENSITIVITY): Troponin I (High Sensitivity): 6 ng/L (ref <18)

## 2023-04-17 LAB — HIV ANTIBODY (ROUTINE TESTING W REFLEX): HIV Screen 4th Generation wRfx: NONREACTIVE

## 2023-04-17 LAB — MRSA NEXT GEN BY PCR, NASAL: MRSA by PCR Next Gen: NOT DETECTED

## 2023-04-17 MED ORDER — RIVAROXABAN 10 MG PO TABS
10.0000 mg | ORAL_TABLET | Freq: Every day | ORAL | Status: DC
  Administered 2023-04-17 – 2023-04-22 (×5): 10 mg via ORAL
  Filled 2023-04-17 (×7): qty 1

## 2023-04-17 MED ORDER — HYDROCOD POLI-CHLORPHE POLI ER 10-8 MG/5ML PO SUER
5.0000 mL | Freq: Once | ORAL | Status: AC
  Administered 2023-04-17: 5 mL via ORAL
  Filled 2023-04-17: qty 5

## 2023-04-17 MED ORDER — ACETAMINOPHEN 325 MG PO TABS
650.0000 mg | ORAL_TABLET | Freq: Four times a day (QID) | ORAL | Status: DC | PRN
  Administered 2023-04-17 – 2023-04-21 (×2): 650 mg via ORAL
  Filled 2023-04-17 (×2): qty 2

## 2023-04-17 MED ORDER — SODIUM CHLORIDE 0.9% FLUSH
3.0000 mL | Freq: Two times a day (BID) | INTRAVENOUS | Status: DC
  Administered 2023-04-17 – 2023-04-22 (×11): 3 mL via INTRAVENOUS

## 2023-04-17 MED ORDER — ONDANSETRON HCL 4 MG/2ML IJ SOLN
4.0000 mg | Freq: Four times a day (QID) | INTRAMUSCULAR | Status: DC | PRN
Start: 2023-04-17 — End: 2023-04-20
  Administered 2023-04-18 – 2023-04-20 (×6): 4 mg via INTRAVENOUS
  Filled 2023-04-17 (×7): qty 2

## 2023-04-17 MED ORDER — CHLORHEXIDINE GLUCONATE CLOTH 2 % EX PADS
6.0000 | MEDICATED_PAD | Freq: Every day | CUTANEOUS | Status: DC
  Administered 2023-04-17 – 2023-04-21 (×2): 6 via TOPICAL

## 2023-04-17 MED ORDER — ACETAMINOPHEN 650 MG RE SUPP
650.0000 mg | Freq: Four times a day (QID) | RECTAL | Status: DC | PRN
  Filled 2023-04-17: qty 1

## 2023-04-17 MED ORDER — SODIUM CHLORIDE 0.9 % IV BOLUS
1000.0000 mL | Freq: Once | INTRAVENOUS | Status: AC
  Administered 2023-04-17: 1000 mL via INTRAVENOUS

## 2023-04-17 MED ORDER — OXYCODONE HCL 5 MG PO TABS
5.0000 mg | ORAL_TABLET | ORAL | Status: DC | PRN
  Administered 2023-04-17 – 2023-04-18 (×2): 5 mg via ORAL
  Filled 2023-04-17 (×3): qty 1

## 2023-04-17 MED ORDER — OSELTAMIVIR PHOSPHATE 75 MG PO CAPS
75.0000 mg | ORAL_CAPSULE | Freq: Two times a day (BID) | ORAL | Status: AC
  Administered 2023-04-17 – 2023-04-21 (×8): 75 mg via ORAL
  Filled 2023-04-17 (×10): qty 1

## 2023-04-17 MED ORDER — ORAL CARE MOUTH RINSE: 15.0000 mL | OROMUCOSAL | Status: DC | PRN

## 2023-04-17 MED ORDER — LACTATED RINGERS IV SOLN: INTRAVENOUS | Status: AC

## 2023-04-17 MED ORDER — OSELTAMIVIR PHOSPHATE 75 MG PO CAPS
75.0000 mg | ORAL_CAPSULE | Freq: Once | ORAL | Status: AC
  Administered 2023-04-17: 75 mg via ORAL
  Filled 2023-04-17: qty 1

## 2023-04-17 MED ORDER — ONDANSETRON HCL 4 MG PO TABS
4.0000 mg | ORAL_TABLET | Freq: Four times a day (QID) | ORAL | Status: DC | PRN
Start: 2023-04-17 — End: 2023-04-20
  Administered 2023-04-18: 4 mg via ORAL
  Filled 2023-04-17: qty 1

## 2023-04-17 NOTE — ED Notes (Signed)
Bedside commode 

## 2023-04-17 NOTE — ED Notes (Signed)
[facility] called for Transport information @12 :42pm.

## 2023-04-17 NOTE — ED Notes (Signed)
Lab called to add urine drug screen.
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
## 2023-04-18 ENCOUNTER — Observation Stay (HOSPITAL_COMMUNITY): Payer: BC Managed Care – PPO

## 2023-04-18 DIAGNOSIS — K219 Gastro-esophageal reflux disease without esophagitis: Secondary | ICD-10-CM | POA: Diagnosis not present

## 2023-04-18 DIAGNOSIS — Z6841 Body Mass Index (BMI) 40.0 and over, adult: Secondary | ICD-10-CM | POA: Diagnosis not present

## 2023-04-18 DIAGNOSIS — R197 Diarrhea, unspecified: Secondary | ICD-10-CM | POA: Diagnosis not present

## 2023-04-18 DIAGNOSIS — J101 Influenza due to other identified influenza virus with other respiratory manifestations: Secondary | ICD-10-CM | POA: Diagnosis present

## 2023-04-18 DIAGNOSIS — Z880 Allergy status to penicillin: Secondary | ICD-10-CM | POA: Diagnosis not present

## 2023-04-18 DIAGNOSIS — Z79899 Other long term (current) drug therapy: Secondary | ICD-10-CM | POA: Diagnosis not present

## 2023-04-18 DIAGNOSIS — R0682 Tachypnea, not elsewhere classified: Secondary | ICD-10-CM | POA: Diagnosis present

## 2023-04-18 DIAGNOSIS — E876 Hypokalemia: Secondary | ICD-10-CM | POA: Diagnosis present

## 2023-04-18 DIAGNOSIS — R112 Nausea with vomiting, unspecified: Secondary | ICD-10-CM | POA: Diagnosis not present

## 2023-04-18 DIAGNOSIS — Z91014 Allergy to mammalian meats: Secondary | ICD-10-CM | POA: Diagnosis not present

## 2023-04-18 DIAGNOSIS — K8020 Calculus of gallbladder without cholecystitis without obstruction: Secondary | ICD-10-CM | POA: Diagnosis present

## 2023-04-18 DIAGNOSIS — Z1152 Encounter for screening for COVID-19: Secondary | ICD-10-CM | POA: Diagnosis not present

## 2023-04-18 LAB — CBC
HCT: 47.5 % (ref 39.0–52.0)
Hemoglobin: 14.8 g/dL (ref 13.0–17.0)
MCH: 26.9 pg (ref 26.0–34.0)
MCHC: 31.2 g/dL (ref 30.0–36.0)
MCV: 86.4 fL (ref 80.0–100.0)
Platelets: 144 10*3/uL — ABNORMAL LOW (ref 150–400)
RBC: 5.5 MIL/uL (ref 4.22–5.81)
RDW: 13.8 % (ref 11.5–15.5)
WBC: 6.8 10*3/uL (ref 4.0–10.5)
nRBC: 0 % (ref 0.0–0.2)

## 2023-04-18 LAB — COMPREHENSIVE METABOLIC PANEL
ALT: 35 U/L (ref 0–44)
AST: 52 U/L — ABNORMAL HIGH (ref 15–41)
Albumin: 3.6 g/dL (ref 3.5–5.0)
Alkaline Phosphatase: 52 U/L (ref 38–126)
Anion gap: 9 (ref 5–15)
BUN: 10 mg/dL (ref 6–20)
CO2: 21 mmol/L — ABNORMAL LOW (ref 22–32)
Calcium: 8.7 mg/dL — ABNORMAL LOW (ref 8.9–10.3)
Chloride: 107 mmol/L (ref 98–111)
Creatinine, Ser: 0.55 mg/dL — ABNORMAL LOW (ref 0.61–1.24)
GFR, Estimated: >60 mL/min (ref >60)
Glucose, Bld: 114 mg/dL — ABNORMAL HIGH (ref 70–99)
Potassium: 3.7 mmol/L (ref 3.5–5.1)
Sodium: 137 mmol/L (ref 135–145)
Total Bilirubin: 1 mg/dL (ref 0.0–1.2)
Total Protein: 7.2 g/dL (ref 6.5–8.1)

## 2023-04-18 LAB — PHOSPHORUS: Phosphorus: 2.3 mg/dL — ABNORMAL LOW (ref 2.5–4.6)

## 2023-04-18 LAB — MAGNESIUM: Magnesium: 2 mg/dL (ref 1.7–2.4)

## 2023-04-18 MED ORDER — OXYCODONE HCL 5 MG PO TABS
5.0000 mg | ORAL_TABLET | ORAL | Status: DC | PRN
  Administered 2023-04-19 (×2): 5 mg via ORAL
  Filled 2023-04-18 (×2): qty 1

## 2023-04-18 MED ORDER — PROCHLORPERAZINE EDISYLATE 10 MG/2ML IJ SOLN
5.0000 mg | Freq: Once | INTRAMUSCULAR | Status: AC | PRN
  Administered 2023-04-18: 5 mg via INTRAVENOUS
  Filled 2023-04-18: qty 2

## 2023-04-18 MED ORDER — LORAZEPAM 2 MG/ML IJ SOLN
0.2500 mg | Freq: Once | INTRAMUSCULAR | Status: AC | PRN
  Administered 2023-04-18: 0.25 mg via INTRAVENOUS
  Filled 2023-04-18: qty 1

## 2023-04-18 MED ORDER — POTASSIUM PHOSPHATES 15 MMOLE/5ML IV SOLN
15.0000 mmol | Freq: Once | INTRAVENOUS | Status: AC
  Administered 2023-04-18: 15 mmol via INTRAVENOUS
  Filled 2023-04-18: qty 5

## 2023-04-18 MED ORDER — HYDROMORPHONE HCL 1 MG/ML IJ SOLN
0.5000 mg | INTRAMUSCULAR | Status: DC | PRN
  Administered 2023-04-18 – 2023-04-21 (×10): 0.5 mg via INTRAVENOUS
  Filled 2023-04-18 (×10): qty 0.5

## 2023-04-18 NOTE — Progress Notes (Signed)
   04/18/23 1147  TOC Brief Assessment  Insurance and Status Reviewed  Patient has primary care physician Yes  Home environment has been reviewed Apartment  Prior level of function: Independent  Prior/Current Home Services No current home services  Social Drivers of Health Review SDOH reviewed no interventions necessary  Readmission risk has been reviewed Yes  Transition of care needs no transition of care needs at this time
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
## 2023-04-18 NOTE — Progress Notes (Signed)
Pt vomiting dark brown contents blue bag half full and pt c/o of abd pain. PRN zofran given.

## 2023-04-19 DIAGNOSIS — R197 Diarrhea, unspecified: Secondary | ICD-10-CM

## 2023-04-19 DIAGNOSIS — R112 Nausea with vomiting, unspecified: Secondary | ICD-10-CM

## 2023-04-19 DIAGNOSIS — J101 Influenza due to other identified influenza virus with other respiratory manifestations: Secondary | ICD-10-CM | POA: Diagnosis not present

## 2023-04-19 DIAGNOSIS — E876 Hypokalemia: Secondary | ICD-10-CM | POA: Diagnosis not present

## 2023-04-19 LAB — COMPREHENSIVE METABOLIC PANEL
ALT: 30 U/L (ref 0–44)
AST: 36 U/L (ref 15–41)
Albumin: 3.5 g/dL (ref 3.5–5.0)
Alkaline Phosphatase: 48 U/L (ref 38–126)
Anion gap: 12 (ref 5–15)
BUN: 10 mg/dL (ref 6–20)
CO2: 23 mmol/L (ref 22–32)
Calcium: 8.4 mg/dL — ABNORMAL LOW (ref 8.9–10.3)
Chloride: 101 mmol/L (ref 98–111)
Creatinine, Ser: 0.78 mg/dL (ref 0.61–1.24)
GFR, Estimated: >60 mL/min (ref >60)
Glucose, Bld: 98 mg/dL (ref 70–99)
Potassium: 3.4 mmol/L — ABNORMAL LOW (ref 3.5–5.1)
Sodium: 136 mmol/L (ref 135–145)
Total Bilirubin: 1.3 mg/dL — ABNORMAL HIGH (ref 0.0–1.2)
Total Protein: 6.7 g/dL (ref 6.5–8.1)

## 2023-04-19 LAB — CBC
HCT: 46.1 % (ref 39.0–52.0)
Hemoglobin: 14.8 g/dL (ref 13.0–17.0)
MCH: 26.5 pg (ref 26.0–34.0)
MCHC: 32.1 g/dL (ref 30.0–36.0)
MCV: 82.5 fL (ref 80.0–100.0)
Platelets: 267 10*3/uL (ref 150–400)
RBC: 5.59 MIL/uL (ref 4.22–5.81)
RDW: 13.2 % (ref 11.5–15.5)
WBC: 8.1 10*3/uL (ref 4.0–10.5)
nRBC: 0 % (ref 0.0–0.2)

## 2023-04-19 LAB — MAGNESIUM: Magnesium: 1.9 mg/dL (ref 1.7–2.4)

## 2023-04-19 LAB — PHOSPHORUS: Phosphorus: 3 mg/dL (ref 2.5–4.6)

## 2023-04-19 NOTE — Plan of Care (Signed)
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
## 2023-04-20 ENCOUNTER — Inpatient Hospital Stay (HOSPITAL_COMMUNITY): Payer: BC Managed Care – PPO

## 2023-04-20 DIAGNOSIS — J101 Influenza due to other identified influenza virus with other respiratory manifestations: Secondary | ICD-10-CM | POA: Diagnosis not present

## 2023-04-20 DIAGNOSIS — R112 Nausea with vomiting, unspecified: Secondary | ICD-10-CM | POA: Diagnosis not present

## 2023-04-20 DIAGNOSIS — R197 Diarrhea, unspecified: Secondary | ICD-10-CM | POA: Diagnosis not present

## 2023-04-20 DIAGNOSIS — E876 Hypokalemia: Secondary | ICD-10-CM | POA: Diagnosis not present

## 2023-04-20 LAB — BASIC METABOLIC PANEL
Anion gap: 11 (ref 5–15)
BUN: 10 mg/dL (ref 6–20)
CO2: 23 mmol/L (ref 22–32)
Calcium: 8.7 mg/dL — ABNORMAL LOW (ref 8.9–10.3)
Chloride: 102 mmol/L (ref 98–111)
Creatinine, Ser: 0.69 mg/dL (ref 0.61–1.24)
GFR, Estimated: >60 mL/min (ref >60)
Glucose, Bld: 100 mg/dL — ABNORMAL HIGH (ref 70–99)
Potassium: 3.4 mmol/L — ABNORMAL LOW (ref 3.5–5.1)
Sodium: 136 mmol/L (ref 135–145)

## 2023-04-20 MED ORDER — MORPHINE SULFATE (PF) 4 MG/ML IV SOLN
3.0000 mg | Freq: Once | INTRAVENOUS | Status: AC
  Administered 2023-04-20: 3 mg via INTRAVENOUS

## 2023-04-20 MED ORDER — MORPHINE SULFATE (PF) 2 MG/ML IV SOLN
INTRAVENOUS | Status: AC
  Filled 2023-04-20: qty 2

## 2023-04-20 MED ORDER — METOCLOPRAMIDE HCL 5 MG/ML IJ SOLN
5.0000 mg | Freq: Four times a day (QID) | INTRAMUSCULAR | Status: AC
  Administered 2023-04-20 – 2023-04-22 (×6): 5 mg via INTRAVENOUS
  Filled 2023-04-20 (×7): qty 2

## 2023-04-20 MED ORDER — POTASSIUM CHLORIDE CRYS ER 20 MEQ PO TBCR: 40.0000 meq | EXTENDED_RELEASE_TABLET | Freq: Two times a day (BID) | ORAL | Status: DC

## 2023-04-20 MED ORDER — SODIUM CHLORIDE 0.9 % IV SOLN
12.5000 mg | Freq: Four times a day (QID) | INTRAVENOUS | Status: DC | PRN
  Administered 2023-04-20 – 2023-04-22 (×4): 12.5 mg via INTRAVENOUS
  Filled 2023-04-20 (×4): qty 12.5

## 2023-04-20 MED ORDER — PANTOPRAZOLE SODIUM 40 MG IV SOLR
40.0000 mg | INTRAVENOUS | Status: DC
Start: 2023-04-20 — End: 2023-04-22
  Administered 2023-04-20 – 2023-04-21 (×2): 40 mg via INTRAVENOUS
  Filled 2023-04-20 (×2): qty 10

## 2023-04-20 MED ORDER — LACTATED RINGERS IV SOLN
INTRAVENOUS | Status: AC
Start: 2023-04-20 — End: 2023-04-21

## 2023-04-20 MED ORDER — POTASSIUM CHLORIDE 10 MEQ/100ML IV SOLN
10.0000 meq | INTRAVENOUS | Status: AC
  Administered 2023-04-20 (×3): 10 meq via INTRAVENOUS
  Filled 2023-04-20 (×2): qty 100

## 2023-04-20 MED ORDER — TECHNETIUM TC 99M MEBROFENIN IV KIT
5.2300 | PACK | Freq: Once | INTRAVENOUS | Status: AC
  Administered 2023-04-20: 5.23 via INTRAVENOUS

## 2023-04-20 NOTE — Plan of Care (Signed)
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
## 2023-04-21 DIAGNOSIS — R112 Nausea with vomiting, unspecified: Secondary | ICD-10-CM | POA: Diagnosis not present

## 2023-04-21 DIAGNOSIS — R197 Diarrhea, unspecified: Secondary | ICD-10-CM | POA: Diagnosis not present

## 2023-04-21 DIAGNOSIS — J101 Influenza due to other identified influenza virus with other respiratory manifestations: Secondary | ICD-10-CM | POA: Diagnosis not present

## 2023-04-21 DIAGNOSIS — E876 Hypokalemia: Secondary | ICD-10-CM | POA: Diagnosis not present

## 2023-04-21 LAB — CBC WITH DIFFERENTIAL/PLATELET
Abs Immature Granulocytes: 0.03 10*3/uL (ref 0.00–0.07)
Basophils Absolute: 0 10*3/uL (ref 0.0–0.1)
Basophils Relative: 1 %
Eosinophils Absolute: 0.1 10*3/uL (ref 0.0–0.5)
Eosinophils Relative: 1 %
HCT: 45.8 % (ref 39.0–52.0)
Hemoglobin: 15.2 g/dL (ref 13.0–17.0)
Immature Granulocytes: 0 %
Lymphocytes Relative: 34 %
Lymphs Abs: 2.8 10*3/uL (ref 0.7–4.0)
MCH: 26.9 pg (ref 26.0–34.0)
MCHC: 33.2 g/dL (ref 30.0–36.0)
MCV: 81.1 fL (ref 80.0–100.0)
Monocytes Absolute: 1 10*3/uL (ref 0.1–1.0)
Monocytes Relative: 12 %
Neutro Abs: 4.2 10*3/uL (ref 1.7–7.7)
Neutrophils Relative %: 52 %
Platelets: 325 10*3/uL (ref 150–400)
RBC: 5.65 MIL/uL (ref 4.22–5.81)
RDW: 13 % (ref 11.5–15.5)
WBC: 8.1 10*3/uL (ref 4.0–10.5)
nRBC: 0 % (ref 0.0–0.2)

## 2023-04-21 LAB — BASIC METABOLIC PANEL
Anion gap: 11 (ref 5–15)
BUN: 11 mg/dL (ref 6–20)
CO2: 24 mmol/L (ref 22–32)
Calcium: 8.5 mg/dL — ABNORMAL LOW (ref 8.9–10.3)
Chloride: 98 mmol/L (ref 98–111)
Creatinine, Ser: 0.75 mg/dL (ref 0.61–1.24)
GFR, Estimated: >60 mL/min (ref >60)
Glucose, Bld: 111 mg/dL — ABNORMAL HIGH (ref 70–99)
Potassium: 3.2 mmol/L — ABNORMAL LOW (ref 3.5–5.1)
Sodium: 133 mmol/L — ABNORMAL LOW (ref 135–145)

## 2023-04-21 MED ORDER — POTASSIUM CHLORIDE 10 MEQ/100ML IV SOLN
10.0000 meq | INTRAVENOUS | Status: AC
  Administered 2023-04-21 (×2): 10 meq via INTRAVENOUS
  Filled 2023-04-21 (×2): qty 100

## 2023-04-21 MED ORDER — KETOROLAC TROMETHAMINE 15 MG/ML IJ SOLN
15.0000 mg | Freq: Once | INTRAMUSCULAR | Status: AC
Start: 2023-04-21 — End: 2023-04-21
  Administered 2023-04-21: 15 mg via INTRAVENOUS
  Filled 2023-04-21: qty 1

## 2023-04-21 MED ORDER — PROCHLORPERAZINE EDISYLATE 10 MG/2ML IJ SOLN
10.0000 mg | Freq: Once | INTRAMUSCULAR | Status: AC
  Administered 2023-04-21: 10 mg via INTRAVENOUS
  Filled 2023-04-21: qty 2

## 2023-04-21 MED ORDER — POTASSIUM CHLORIDE 10 MEQ/100ML IV SOLN
10.0000 meq | INTRAVENOUS | Status: AC
Start: 2023-04-21 — End: 2023-04-21
  Administered 2023-04-21: 10 meq via INTRAVENOUS
  Filled 2023-04-21: qty 100

## 2023-04-21 MED ORDER — OXYCODONE HCL 5 MG PO TABS
5.0000 mg | ORAL_TABLET | Freq: Four times a day (QID) | ORAL | Status: AC | PRN
  Administered 2023-04-21 – 2023-04-22 (×2): 5 mg via ORAL
  Filled 2023-04-21 (×2): qty 1

## 2023-04-21 NOTE — Plan of Care (Signed)
  Problem: Education: Goal: Knowledge of General Education information will improve Description: Including pain rating scale, medication(s)/side effects and non-pharmacologic comfort measures Outcome: Progressing   Problem: Clinical Measurements: Goal: Will remain free from infection Outcome: Progressing Goal: Respiratory complications will improve Outcome: Progressing   Problem: Activity: Goal: Risk for activity intolerance will decrease Outcome: Progressing   Problem: Nutrition: Goal: Adequate nutrition will be maintained Outcome: Progressing   Problem: Coping: Goal: Level of anxiety will decrease Outcome: Progressing   Problem: Elimination: Goal: Will not experience complications related to bowel motility Outcome: Progressing Goal: Will not experience complications related to urinary retention Outcome: Progressing   Problem: Pain Managment: Goal: General experience of comfort will improve and/or be controlled Outcome: Progressing

## 2023-04-21 NOTE — Plan of Care (Signed)
Pt continues to have nausea, vomiting, and abdominal pain during this shift. Pt. Took a shower to help with symptoms. Problem: Education: Goal: Knowledge of General Education information will improve Description: Including pain rating scale, medication(s)/side effects and non-pharmacologic comfort measures Outcome: Progressing   Problem: Health Behavior/Discharge Planning: Goal: Ability to manage health-related needs will improve Outcome: Progressing   Problem: Clinical Measurements: Goal: Ability to maintain clinical measurements within normal limits will improve Outcome: Progressing Goal: Will remain free from infection Outcome: Progressing Goal: Diagnostic test results will improve Outcome: Progressing Goal: Respiratory complications will improve Outcome: Progressing Goal: Cardiovascular complication will be avoided Outcome: Progressing   Problem: Activity: Goal: Risk for activity intolerance will decrease Outcome: Progressing   Problem: Nutrition: Goal: Adequate nutrition will be maintained Outcome: Progressing   Problem: Coping: Goal: Level of anxiety will decrease Outcome: Progressing   Problem: Elimination: Goal: Will not experience complications related to bowel motility Outcome: Progressing Goal: Will not experience complications related to urinary retention Outcome: Progressing   Problem: Pain Managment: Goal: General experience of comfort will improve and/or be controlled Outcome: Progressing   Problem: Safety: Goal: Ability to remain free from injury will improve Outcome: Progressing   Problem: Skin Integrity: Goal: Risk for impaired skin integrity will decrease Outcome: Progressing
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
## 2023-04-22 DIAGNOSIS — J101 Influenza due to other identified influenza virus with other respiratory manifestations: Secondary | ICD-10-CM | POA: Diagnosis not present

## 2023-04-22 DIAGNOSIS — R112 Nausea with vomiting, unspecified: Secondary | ICD-10-CM | POA: Diagnosis not present

## 2023-04-22 DIAGNOSIS — K219 Gastro-esophageal reflux disease without esophagitis: Secondary | ICD-10-CM

## 2023-04-22 MED ORDER — OSELTAMIVIR PHOSPHATE 75 MG PO CAPS: 75.0000 mg | ORAL_CAPSULE | Freq: Two times a day (BID) | ORAL | 0 refills | Status: AC

## 2023-04-22 MED ORDER — PANTOPRAZOLE SODIUM 40 MG PO TBEC: 40.0000 mg | DELAYED_RELEASE_TABLET | Freq: Every day | ORAL | 1 refills | Status: AC
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
## 2023-04-22 NOTE — Plan of Care (Signed)
  Problem: Clinical Measurements: Goal: Diagnostic test results will improve Outcome: Progressing   Problem: Activity: Goal: Risk for activity intolerance will decrease Outcome: Progressing   Problem: Pain Managment: Goal: General experience of comfort will improve and/or be controlled Outcome: Progressing   Problem: Nutrition: Goal: Adequate nutrition will be maintained Outcome: Not Progressing

## 2023-04-23 ENCOUNTER — Emergency Department (HOSPITAL_COMMUNITY)
Admission: EM | Admit: 2023-04-23 | Discharge: 2023-04-23 | Disposition: A | Discharge status: Home / Self Care | Payer: BC Managed Care – PPO | Attending: Emergency Medicine | Admitting: Emergency Medicine

## 2023-04-23 ENCOUNTER — Emergency Department (HOSPITAL_COMMUNITY): Payer: BC Managed Care – PPO

## 2023-04-23 ENCOUNTER — Other Ambulatory Visit: Payer: Self-pay

## 2023-04-23 DIAGNOSIS — R112 Nausea with vomiting, unspecified: Secondary | ICD-10-CM

## 2023-04-23 DIAGNOSIS — K8020 Calculus of gallbladder without cholecystitis without obstruction: Secondary | ICD-10-CM | POA: Insufficient documentation

## 2023-04-23 DIAGNOSIS — K8000 Calculus of gallbladder with acute cholecystitis without obstruction: Secondary | ICD-10-CM | POA: Diagnosis not present

## 2023-04-23 LAB — COMPREHENSIVE METABOLIC PANEL
ALT: 27 U/L (ref 0–44)
AST: 29 U/L (ref 15–41)
Albumin: 3.9 g/dL (ref 3.5–5.0)
Alkaline Phosphatase: 55 U/L (ref 38–126)
Anion gap: 19 — ABNORMAL HIGH (ref 5–15)
BUN: 13 mg/dL (ref 6–20)
CO2: 21 mmol/L — ABNORMAL LOW (ref 22–32)
Calcium: 9.4 mg/dL (ref 8.9–10.3)
Chloride: 100 mmol/L (ref 98–111)
Creatinine, Ser: 0.89 mg/dL (ref 0.61–1.24)
GFR, Estimated: >60 mL/min (ref >60)
Glucose, Bld: 100 mg/dL — ABNORMAL HIGH (ref 70–99)
Potassium: 4.2 mmol/L (ref 3.5–5.1)
Sodium: 140 mmol/L (ref 135–145)
Total Bilirubin: 3.1 mg/dL — ABNORMAL HIGH (ref 0.0–1.2)
Total Protein: 7.9 g/dL (ref 6.5–8.1)

## 2023-04-23 LAB — URINALYSIS, ROUTINE W REFLEX MICROSCOPIC
Bacteria, UA: NONE SEEN
Bilirubin Urine: NEGATIVE
Glucose, UA: NEGATIVE mg/dL
Ketones, ur: 80 mg/dL — AB
Leukocytes,Ua: NEGATIVE
Nitrite: NEGATIVE
Protein, ur: NEGATIVE mg/dL
Specific Gravity, Urine: 1.015 (ref 1.005–1.030)
pH: 5 (ref 5.0–8.0)

## 2023-04-23 LAB — CBC WITH DIFFERENTIAL/PLATELET
Abs Immature Granulocytes: 0.09 10*3/uL — ABNORMAL HIGH (ref 0.00–0.07)
Basophils Absolute: 0 10*3/uL (ref 0.0–0.1)
Basophils Relative: 0 %
Eosinophils Absolute: 0.1 10*3/uL (ref 0.0–0.5)
Eosinophils Relative: 1 %
HCT: 46 % (ref 39.0–52.0)
Hemoglobin: 15.6 g/dL (ref 13.0–17.0)
Immature Granulocytes: 1 %
Lymphocytes Relative: 24 %
Lymphs Abs: 3.2 10*3/uL (ref 0.7–4.0)
MCH: 27.5 pg (ref 26.0–34.0)
MCHC: 33.9 g/dL (ref 30.0–36.0)
MCV: 81.1 fL (ref 80.0–100.0)
Monocytes Absolute: 1.2 10*3/uL — ABNORMAL HIGH (ref 0.1–1.0)
Monocytes Relative: 9 %
Neutro Abs: 8.8 10*3/uL — ABNORMAL HIGH (ref 1.7–7.7)
Neutrophils Relative %: 65 %
Platelets: 401 10*3/uL — ABNORMAL HIGH (ref 150–400)
RBC: 5.67 MIL/uL (ref 4.22–5.81)
RDW: 13.2 % (ref 11.5–15.5)
WBC: 13.4 10*3/uL — ABNORMAL HIGH (ref 4.0–10.5)
nRBC: 0 % (ref 0.0–0.2)

## 2023-04-23 LAB — LIPASE, BLOOD: Lipase: 20 U/L (ref 11–51)

## 2023-04-23 MED ORDER — SODIUM CHLORIDE 0.9 % IV SOLN
12.5000 mg | Freq: Four times a day (QID) | INTRAVENOUS | Status: DC | PRN
  Administered 2023-04-23: 12.5 mg via INTRAVENOUS
  Filled 2023-04-23: qty 12.5

## 2023-04-23 MED ORDER — IOHEXOL 300 MG/ML  SOLN
100.0000 mL | Freq: Once | INTRAMUSCULAR | Status: AC | PRN
Start: 2023-04-23 — End: 2023-04-23
  Administered 2023-04-23: 100 mL via INTRAVENOUS

## 2023-04-23 MED ORDER — PANTOPRAZOLE SODIUM 40 MG IV SOLR
40.0000 mg | Freq: Once | INTRAVENOUS | Status: AC
  Administered 2023-04-23: 40 mg via INTRAVENOUS
  Filled 2023-04-23: qty 10

## 2023-04-23 MED ORDER — ONDANSETRON HCL 4 MG/2ML IJ SOLN
4.0000 mg | Freq: Once | INTRAMUSCULAR | Status: AC
  Administered 2023-04-23: 4 mg via INTRAVENOUS
  Filled 2023-04-23: qty 2

## 2023-04-23 MED ORDER — PROMETHAZINE HCL 25 MG PO TABS: 25.0000 mg | ORAL_TABLET | Freq: Four times a day (QID) | ORAL | 0 refills | Status: AC | PRN

## 2023-04-23 MED ORDER — SODIUM CHLORIDE 0.9 % IV BOLUS
1000.0000 mL | Freq: Once | INTRAVENOUS | Status: AC
  Administered 2023-04-23: 1000 mL via INTRAVENOUS

## 2023-04-23 NOTE — ED Provider Notes (Signed)
[place] EMERGENCY DEPARTMENT AT [facility] Provider Note   CSN: [id number] Arrival date & time: 04/23/23  6295     History  Chief Complaint  Patient presents with   Abdominal Pain    Gregory Myers is a 33 y.o. male.  Patient here with ongoing nausea vomiting.  Just was admitted to the hospital for flu.  Had some episodes of emesis this morning noticed little bit of blood in the vomit.  Denies any black or bloody stools.  He is on any blood thinners.  He was discharged on Tamiflu Protonix.  He denies any chest pain or abdominal pain at this time.  No diarrhea.  Denies any weakness numbness tingling.  Denies any headaches.  Nothing makes it better or worse.  The history is provided by the patient.       Home Medications Prior to Admission medications   Medication Sig Start Date End Date Taking? Authorizing Provider  promethazine (PHENERGAN) 25 MG tablet Take 1 tablet (25 mg total) by mouth every 6 (six) hours as needed for nausea or vomiting. 04/23/23  Yes Ziair Penson, DO  ondansetron (ZOFRAN) 4 MG tablet Take 1 tablet (4 mg total) by mouth every 8 (eight) hours as needed for nausea or vomiting. 04/16/23   Terrilee Files, MD  oseltamivir (TAMIFLU) 75 MG capsule Take 1 capsule (75 mg total) by mouth every 12 (twelve) hours for 1 day. 04/22/23 04/23/23  Kathlen Mody, MD  pantoprazole (PROTONIX) 40 MG tablet Take 1 tablet (40 mg total) by mouth daily. 04/22/23 06/21/23  Kathlen Mody, MD      Allergies    Amoxicillin and Pork-derived products    Review of Systems   Review of Systems  Physical Exam Updated Vital Signs BP (!) 140/78 (BP Location: Right Arm)   Pulse (!) 105   Temp 98.7 F (37.1 C) (Oral)   Resp 16   SpO2 100%  Physical Exam Vitals and nursing note reviewed.  Constitutional:      General: He is not in acute distress.    Appearance: He is well-developed. He is not ill-appearing.  HENT:     Head: Normocephalic and atraumatic.     Mouth/Throat:      Mouth: Mucous membranes are moist.  Eyes:     Extraocular Movements: Extraocular movements intact.     Conjunctiva/sclera: Conjunctivae normal.  Cardiovascular:     Rate and Rhythm: Normal rate and regular rhythm.     Heart sounds: Normal heart sounds. No murmur heard. Pulmonary:     Effort: Pulmonary effort is normal. No respiratory distress.     Breath sounds: Normal breath sounds.  Abdominal:     General: Bowel sounds are normal.     Palpations: Abdomen is soft.     Tenderness: There is no abdominal tenderness. There is no guarding or rebound. Negative signs include Murphy's sign and Rovsing's sign.  Musculoskeletal:        General: No swelling.     Cervical back: Neck supple.  Skin:    General: Skin is warm and dry.     Capillary Refill: Capillary refill takes less than 2 seconds.  Neurological:     Mental Status: He is alert.  Psychiatric:        Mood and Affect: Mood normal.     ED Results / Procedures / Treatments   Labs (all labs ordered are listed, but only abnormal results are displayed) Labs Reviewed  CBC WITH DIFFERENTIAL/PLATELET - Abnormal; Notable for  the following components:      Result Value   WBC 13.4 (*)    Platelets 401 (*)    Neutro Abs 8.8 (*)    Monocytes Absolute 1.2 (*)    Abs Immature Granulocytes 0.09 (*)    All other components within normal limits  COMPREHENSIVE METABOLIC PANEL - Abnormal; Notable for the following components:   CO2 21 (*)    Glucose, Bld 100 (*)    Total Bilirubin 3.1 (*)    Anion gap 19 (*)    All other components within normal limits  URINALYSIS, ROUTINE W REFLEX MICROSCOPIC - Abnormal; Notable for the following components:   Hgb urine dipstick SMALL (*)    Ketones, ur 80 (*)    All other components within normal limits  LIPASE, BLOOD    EKG None  Radiology US Abdomen Limited RUQ (LIVER/GB) Result Date: 04/23/2023 CLINICAL DATA:  One-week history of right upper quadrant pain EXAM: ULTRASOUND ABDOMEN  LIMITED RIGHT UPPER QUADRANT COMPARISON:  Same day CT abdomen and pelvis FINDINGS: Gallbladder: No wall thickening visualized. Cholelithiasis measuring up to 10 mm. No sonographic Murphy sign noted by sonographer. Common bile duct: Diameter: 4 mm Liver: No focal lesion identified. Within normal limits in parenchymal echogenicity. Portal vein is patent on color Doppler imaging with normal direction of blood flow towards the liver. Other: None. IMPRESSION: Cholelithiasis without sonographic evidence of acute cholecystitis. Electronically Signed   By: Agustin Cree M.D.   On: 04/23/2023 13:38   CT ABDOMEN PELVIS W CONTRAST Result Date: 04/23/2023 CLINICAL DATA:  Abdominal pain, acute, nonlocalized. EXAM: CT ABDOMEN AND PELVIS WITH CONTRAST TECHNIQUE: Multidetector CT imaging of the abdomen and pelvis was performed using the standard protocol following bolus administration of intravenous contrast. RADIATION DOSE REDUCTION: This exam was performed according to the departmental dose-optimization program which includes automated exposure control, adjustment of the mA and/or kV according to patient size and/or use of iterative reconstruction technique. CONTRAST:  OMNIPAQUE IOHEXOL 300 MG/ML  SOLN COMPARISON:  CT scan abdomen and pelvis from 04/17/2023. FINDINGS: Lower chest: There are new, patchy ground-glass opacities in the visualized bilateral lungs, nonspecific but commonly seen as a sequela of infection or inflammation. The lung bases are clear. No pleural effusion. The heart is normal in size. No pericardial effusion. Hepatobiliary: The liver is normal in size. Non-cirrhotic configuration. No suspicious mass. These is mild diffuse hepatic steatosis. No intrahepatic or extrahepatic bile duct dilation. Physiologically distended gallbladder. Redemonstration of tiny layering sub 5 mm calcified gallstones/sludge without imaging signs of acute cholecystitis. No abnormal wall thickening or pericholecystic free fluid.  Redemonstration of a sub 5 mm calcification in the expected location of gallbladder neck/cystic duct region essentially similar to the prior study. Pancreas: Unremarkable. No pancreatic ductal dilatation or surrounding inflammatory changes. Spleen: Within normal limits. No focal lesion. Adrenals/Urinary Tract: Adrenal glands are unremarkable. No suspicious renal mass. No hydronephrosis. No renal or ureteric calculi. Urinary bladder is under distended, precluding optimal assessment. However, no large mass or stones identified. No perivesical fat stranding. Stomach/Bowel: No disproportionate dilation of the small or large bowel loops. No evidence of abnormal bowel wall thickening or inflammatory changes. The appendix is unremarkable. Vascular/Lymphatic: No ascites or pneumoperitoneum. No abdominal or pelvic lymphadenopathy, by size criteria. No aneurysmal dilation of the major abdominal arteries. Reproductive: Normal size prostate. Symmetric seminal vesicles. Other: The visualized soft tissues and abdominal wall are unremarkable. Musculoskeletal: No suspicious osseous lesions. IMPRESSION: 1. Since the prior study, there are new, patchy, ground-glass  opacities in the visualized bilateral lungs, nonspecific but commonly seen as a sequela of infection or inflammation. Correlate clinically. 2. Redemonstration of small volume layering calcified gallstones/sludge without imaging signs of acute cholecystitis. There is a persistent sub 5 mm calcification in the expected location of gallbladder neck/cystic duct, essentially similar to the prior study. 3. Multiple other nonacute observations, as described above. Electronically Signed   By: Jules Schick M.D.   On: 04/23/2023 11:38   DG Chest Portable 1 View Result Date: 04/23/2023 CLINICAL DATA:  Cough.  Vomiting. EXAM: PORTABLE CHEST 1 VIEW COMPARISON:  Chest radiograph dated April 16, 2023. FINDINGS: The heart size and mediastinal contours are within normal limits. No  focal consolidation, sizeable pleural effusion, or pneumothorax. No acute osseous abnormality. IMPRESSION: No acute cardiopulmonary findings. Electronically Signed   By: Hart Robinsons M.D.   On: 04/23/2023 09:06    Procedures Procedures    Medications Ordered in ED Medications  promethazine (PHENERGAN) 12.5 mg in sodium chloride 0.9 % 50 mL IVPB (0 mg Intravenous Stopped 04/23/23 0859)  sodium chloride 0.9 % bolus 1,000 mL (0 mLs Intravenous Stopped 04/23/23 0900)  ondansetron (ZOFRAN) injection 4 mg (4 mg Intravenous Given 04/23/23 0800)  pantoprazole (PROTONIX) injection 40 mg (40 mg Intravenous Given 04/23/23 0838)  iohexol (OMNIPAQUE) 300 MG/ML solution 100 mL (100 mLs Intravenous Contrast Given 04/23/23 1055)    ED Course/ Medical Decision Making/ A&P                                 Medical Decision Making Amount and/or Complexity of Data Reviewed Labs: ordered. Radiology: ordered.  Risk Prescription drug management.   Gregory Myers is here with nausea and vomiting.  Just discharged in the hospital with fluid nausea and vomiting.  He is on Tamiflu Protonix Zofran.  Overall abdominal exam is benign at this time.  He had some blood in his vomit this morning but he is not on any blood thinners.  No black or bloody stools.  I suspect that this is likely inflammation from vomiting.  He does not appear to have any abnormalities of his vitals.  Suspect that this is ongoing viral symptoms but could be Tamiflu side effect.  Seems less likely to be intra-abdominal process or cardiac or pulmonary process.  Will get basic labs chest x-ray give IV fluids IV Zofran and reevaluate.  Will consider CT imaging if needed.  Lab work significant for mild leukocytosis at 13.4 but otherwise no significant anemia.  Gallbladder enzyme slightly elevated 3.1.  Lipase normal.  CT scan was obtained that showed may be some new patchy groundglass opacities in the bilateral lungs but nonspecific likely from recent  flu.  As far as the abdomen there continues to be some gallstones and sludge without any evidence of acute cholecystitis.  Will get a ultrasound to further evaluate and make sure there is no acute cholecystitis.  He did have a HIDA scan couple days ago that also confirmed no acute cholecystitis and open ducts.  Not sure if this is all gastritis but he is feeling a lot better following antiemetics.  He would like to eat something but at this time I think I like to get an ultrasound to further rule out acute cholecystitis and then have him reevaluated.  He is not having any respiratory symptoms.  Overall ultrasound shows gallstones but no evidence of cholecystitis.  He is feeling better.  Will prescribe  Phenergan.  Will have him follow-up with surgery and primary care doctor.  Discharged in good condition.  This chart was dictated using voice recognition software.  Despite best efforts to proofread,  errors can occur which can change the documentation meaning.         Final Clinical Impression(s) / ED Diagnoses Final diagnoses:  Gallstones  Nausea and vomiting, unspecified vomiting type    Rx / DC Orders ED Discharge Orders          Ordered    promethazine (PHENERGAN) 25 MG tablet  Every 6 hours PRN        04/23/23 1345              Cathye Kreiter, DO 04/23/23 1346

## 2023-04-23 NOTE — ED Triage Notes (Signed)
Pt reports being discharged this morning, returns with concerns of vomiting blood, pt states he has vomited blood twice this morning.

## 2023-04-24 ENCOUNTER — Emergency Department (HOSPITAL_COMMUNITY): Payer: BC Managed Care – PPO

## 2023-04-24 ENCOUNTER — Ambulatory Visit: Payer: Self-pay | Admitting: Surgery

## 2023-04-24 ENCOUNTER — Encounter (HOSPITAL_COMMUNITY): Payer: Self-pay

## 2023-04-24 ENCOUNTER — Other Ambulatory Visit: Payer: Self-pay | Admitting: Surgery

## 2023-04-24 ENCOUNTER — Encounter (HOSPITAL_COMMUNITY): Payer: Self-pay | Admitting: Emergency Medicine

## 2023-04-24 ENCOUNTER — Other Ambulatory Visit: Payer: Self-pay

## 2023-04-24 ENCOUNTER — Inpatient Hospital Stay (HOSPITAL_COMMUNITY)
Admission: EM | Admit: 2023-04-24 | Discharge: 2023-04-27 | DRG: 418 | Disposition: A | Discharge status: Home / Self Care | Payer: BC Managed Care – PPO | Attending: General Surgery | Admitting: General Surgery

## 2023-04-24 ENCOUNTER — Inpatient Hospital Stay: Admit: 2023-04-24 | Payer: BC Managed Care – PPO

## 2023-04-24 DIAGNOSIS — Z880 Allergy status to penicillin: Secondary | ICD-10-CM

## 2023-04-24 DIAGNOSIS — K8001 Calculus of gallbladder with acute cholecystitis with obstruction: Secondary | ICD-10-CM

## 2023-04-24 DIAGNOSIS — E66813 Obesity, class 3: Secondary | ICD-10-CM | POA: Diagnosis present

## 2023-04-24 DIAGNOSIS — K8000 Calculus of gallbladder with acute cholecystitis without obstruction: Principal | ICD-10-CM | POA: Diagnosis present

## 2023-04-24 DIAGNOSIS — Z6841 Body Mass Index (BMI) 40.0 and over, adult: Secondary | ICD-10-CM

## 2023-04-24 DIAGNOSIS — K660 Peritoneal adhesions (postprocedural) (postinfection): Secondary | ICD-10-CM | POA: Diagnosis present

## 2023-04-24 DIAGNOSIS — K219 Gastro-esophageal reflux disease without esophagitis: Secondary | ICD-10-CM | POA: Diagnosis present

## 2023-04-24 DIAGNOSIS — K8020 Calculus of gallbladder without cholecystitis without obstruction: Principal | ICD-10-CM | POA: Diagnosis present

## 2023-04-24 DIAGNOSIS — Z91014 Allergy to mammalian meats: Secondary | ICD-10-CM

## 2023-04-24 DIAGNOSIS — Z79899 Other long term (current) drug therapy: Secondary | ICD-10-CM

## 2023-04-24 LAB — CBC WITH DIFFERENTIAL/PLATELET
Abs Immature Granulocytes: 0.13 10*3/uL — ABNORMAL HIGH (ref 0.00–0.07)
Basophils Absolute: 0 10*3/uL (ref 0.0–0.1)
Basophils Relative: 0 %
Eosinophils Absolute: 0.1 10*3/uL (ref 0.0–0.5)
Eosinophils Relative: 0 %
HCT: 48.1 % (ref 39.0–52.0)
Hemoglobin: 15.8 g/dL (ref 13.0–17.0)
Immature Granulocytes: 1 %
Lymphocytes Relative: 18 %
Lymphs Abs: 3 10*3/uL (ref 0.7–4.0)
MCH: 26.9 pg (ref 26.0–34.0)
MCHC: 32.8 g/dL (ref 30.0–36.0)
MCV: 81.9 fL (ref 80.0–100.0)
Monocytes Absolute: 1.4 10*3/uL — ABNORMAL HIGH (ref 0.1–1.0)
Monocytes Relative: 8 %
Neutro Abs: 12.4 10*3/uL — ABNORMAL HIGH (ref 1.7–7.7)
Neutrophils Relative %: 73 %
Platelets: 480 10*3/uL — ABNORMAL HIGH (ref 150–400)
RBC: 5.87 MIL/uL — ABNORMAL HIGH (ref 4.22–5.81)
RDW: 13.2 % (ref 11.5–15.5)
WBC: 17.1 10*3/uL — ABNORMAL HIGH (ref 4.0–10.5)
nRBC: 0 % (ref 0.0–0.2)

## 2023-04-24 LAB — COMPREHENSIVE METABOLIC PANEL
ALT: 29 U/L (ref 0–44)
AST: 28 U/L (ref 15–41)
Albumin: 4.3 g/dL (ref 3.5–5.0)
Alkaline Phosphatase: 56 U/L (ref 38–126)
Anion gap: 15 (ref 5–15)
BUN: 16 mg/dL (ref 6–20)
CO2: 23 mmol/L (ref 22–32)
Calcium: 9.4 mg/dL (ref 8.9–10.3)
Chloride: 96 mmol/L — ABNORMAL LOW (ref 98–111)
Creatinine, Ser: 0.98 mg/dL (ref 0.61–1.24)
GFR, Estimated: >60 mL/min (ref >60)
Glucose, Bld: 124 mg/dL — ABNORMAL HIGH (ref 70–99)
Potassium: 3.3 mmol/L — ABNORMAL LOW (ref 3.5–5.1)
Sodium: 134 mmol/L — ABNORMAL LOW (ref 135–145)
Total Bilirubin: 2.2 mg/dL — ABNORMAL HIGH (ref 0.0–1.2)
Total Protein: 8.6 g/dL — ABNORMAL HIGH (ref 6.5–8.1)

## 2023-04-24 LAB — LIPASE, BLOOD: Lipase: 22 U/L (ref 11–51)

## 2023-04-24 MED ORDER — SODIUM CHLORIDE 0.9 % IV SOLN: 4.0000 mg | Freq: Four times a day (QID) | INTRAVENOUS | Status: DC | PRN

## 2023-04-24 MED ORDER — HYDROMORPHONE HCL 2 MG/ML IJ SOLN: 1.0000 mg | INTRAMUSCULAR | Status: DC | PRN

## 2023-04-24 MED ORDER — ACETAMINOPHEN 325 MG PO TABS: 650.0000 mg | ORAL_TABLET | Freq: Four times a day (QID) | ORAL | Status: DC | PRN

## 2023-04-24 MED ORDER — ONDANSETRON 4 MG PO TBDP: 4.0000 mg | ORAL_TABLET | Freq: Four times a day (QID) | ORAL | Status: DC | PRN

## 2023-04-24 MED ORDER — DIPHENHYDRAMINE HCL 25 MG PO CAPS: 25.0000 mg | ORAL_CAPSULE | Freq: Four times a day (QID) | ORAL | Status: DC | PRN

## 2023-04-24 MED ORDER — DIPHENHYDRAMINE HCL 50 MG/ML IJ SOLN: 25.0000 mg | Freq: Four times a day (QID) | INTRAMUSCULAR | Status: DC | PRN

## 2023-04-24 MED ORDER — ACETAMINOPHEN 650 MG RE SUPP: 650.0000 mg | Freq: Four times a day (QID) | RECTAL | Status: DC | PRN

## 2023-04-24 MED ORDER — DEXTROSE 5 % IV SOLN: 2.0000 g | INTRAVENOUS | Status: DC

## 2023-04-24 MED ORDER — POTASSIUM CHLORIDE IN NACL 20-0.9 MEQ/L-% IV SOLN: INTRAVENOUS | Status: DC

## 2023-04-24 MED ORDER — ONDANSETRON 4 MG PO TBDP
4.0000 mg | ORAL_TABLET | Freq: Once | ORAL | Status: AC
  Administered 2023-04-24: 4 mg via ORAL
  Filled 2023-04-24: qty 1

## 2023-04-24 NOTE — ED Provider Triage Note (Signed)
Emergency Medicine Provider Triage Evaluation Note  Gregory Myers , a 33 y.o. male  was evaluated in triage.  Pt complains of nausea vomiting along with right upper quadrant pain.  Patient was seen yesterday and diagnosed with cholelithiasis and discharged on Phenergan however states this has not been helping at home.  Patient denies any new symptoms including chest pain shortness of breath fevers dysuria back pain.  Review of Systems  Positive:  Negative:   Physical Exam  BP 126/86 (BP Location: Left Arm)   Pulse 90   Temp 98.4 F (36.9 C) (Oral)   Resp 16   Ht 5\' 5"  (1.651 m)   Wt 114 kg   SpO2 90%   BMI 41.82 kg/m  Gen:   Awake, no distress   Resp:  Normal effort  MSK:   Moves extremities without difficulty  Other:  Lower quadrant tenderness without peritoneal signs, no jaundice or scleral icterus noted  Medical Decision Making  Medically screening exam initiated at 7:12 PM.  Appropriate orders placed.  Gregory Myers was informed that the remainder of the evaluation will be completed by another provider, this initial triage assessment does not replace that evaluation, and the importance of remaining in the ED until their evaluation is complete.  Workup initiated, patient stable at this time.   Gregory Myers, [place] 04/24/23 1912
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
## 2023-04-24 NOTE — ED Notes (Signed)
Pt stated symptoms are now worsening and is vomiting blood

## 2023-04-24 NOTE — ED Triage Notes (Signed)
Patient BIB EMS c/o abdominal pain x9 days. Patient was seen yesterday with same complain. Patient report worsening pain tonight. Patient report N/V.

## 2023-04-25 ENCOUNTER — Encounter (HOSPITAL_COMMUNITY): Admission: EM | Disposition: A | Discharge status: Home / Self Care | Payer: Self-pay | Source: Home / Self Care

## 2023-04-25 ENCOUNTER — Inpatient Hospital Stay (HOSPITAL_COMMUNITY): Payer: BC Managed Care – PPO | Admitting: Certified Registered Nurse Anesthetist

## 2023-04-25 ENCOUNTER — Encounter (HOSPITAL_COMMUNITY): Payer: Self-pay | Admitting: Surgery

## 2023-04-25 ENCOUNTER — Other Ambulatory Visit: Payer: Self-pay

## 2023-04-25 DIAGNOSIS — K8000 Calculus of gallbladder with acute cholecystitis without obstruction: Secondary | ICD-10-CM | POA: Diagnosis present

## 2023-04-25 DIAGNOSIS — E66813 Obesity, class 3: Secondary | ICD-10-CM | POA: Diagnosis present

## 2023-04-25 DIAGNOSIS — R17 Unspecified jaundice: Secondary | ICD-10-CM | POA: Diagnosis not present

## 2023-04-25 DIAGNOSIS — K8020 Calculus of gallbladder without cholecystitis without obstruction: Principal | ICD-10-CM | POA: Diagnosis present

## 2023-04-25 DIAGNOSIS — R7989 Other specified abnormal findings of blood chemistry: Secondary | ICD-10-CM | POA: Diagnosis not present

## 2023-04-25 DIAGNOSIS — Z880 Allergy status to penicillin: Secondary | ICD-10-CM | POA: Diagnosis not present

## 2023-04-25 DIAGNOSIS — K219 Gastro-esophageal reflux disease without esophagitis: Secondary | ICD-10-CM | POA: Diagnosis present

## 2023-04-25 DIAGNOSIS — K660 Peritoneal adhesions (postprocedural) (postinfection): Secondary | ICD-10-CM | POA: Diagnosis present

## 2023-04-25 DIAGNOSIS — Z91014 Allergy to mammalian meats: Secondary | ICD-10-CM | POA: Diagnosis not present

## 2023-04-25 DIAGNOSIS — K8040 Calculus of bile duct with cholecystitis, unspecified, without obstruction: Secondary | ICD-10-CM

## 2023-04-25 DIAGNOSIS — R933 Abnormal findings on diagnostic imaging of other parts of digestive tract: Secondary | ICD-10-CM | POA: Diagnosis not present

## 2023-04-25 DIAGNOSIS — Z79899 Other long term (current) drug therapy: Secondary | ICD-10-CM | POA: Diagnosis not present

## 2023-04-25 DIAGNOSIS — Z6841 Body Mass Index (BMI) 40.0 and over, adult: Secondary | ICD-10-CM | POA: Diagnosis not present

## 2023-04-25 HISTORY — PX: CHOLECYSTECTOMY: SHX55

## 2023-04-25 LAB — COMPREHENSIVE METABOLIC PANEL
ALT: 42 U/L (ref 0–44)
AST: 42 U/L — ABNORMAL HIGH (ref 15–41)
Albumin: 3.8 g/dL (ref 3.5–5.0)
Alkaline Phosphatase: 51 U/L (ref 38–126)
Anion gap: 13 (ref 5–15)
BUN: 19 mg/dL (ref 6–20)
CO2: 26 mmol/L (ref 22–32)
Calcium: 9 mg/dL (ref 8.9–10.3)
Chloride: 97 mmol/L — ABNORMAL LOW (ref 98–111)
Creatinine, Ser: 1.21 mg/dL (ref 0.61–1.24)
GFR, Estimated: >60 mL/min (ref >60)
Glucose, Bld: 144 mg/dL — ABNORMAL HIGH (ref 70–99)
Potassium: 3.5 mmol/L (ref 3.5–5.1)
Sodium: 136 mmol/L (ref 135–145)
Total Bilirubin: 1.8 mg/dL — ABNORMAL HIGH (ref 0.0–1.2)
Total Protein: 7.2 g/dL (ref 6.5–8.1)

## 2023-04-25 SURGERY — LAPAROSCOPIC CHOLECYSTECTOMY
Anesthesia: General | Site: Abdomen

## 2023-04-25 MED ORDER — ONDANSETRON 4 MG PO TBDP: 4.0000 mg | ORAL_TABLET | Freq: Four times a day (QID) | ORAL | Status: DC | PRN

## 2023-04-25 MED ORDER — ACETAMINOPHEN 500 MG PO TABS: 1000.0000 mg | ORAL_TABLET | Freq: Once | ORAL | Status: DC | PRN

## 2023-04-25 MED ORDER — FENTANYL CITRATE (PF) 250 MCG/5ML IJ SOLN
INTRAMUSCULAR | Status: AC
  Filled 2023-04-25: qty 5

## 2023-04-25 MED ORDER — FENTANYL CITRATE (PF) 100 MCG/2ML IJ SOLN
INTRAMUSCULAR | Status: AC
  Filled 2023-04-25: qty 2

## 2023-04-25 MED ORDER — HYDROMORPHONE HCL 1 MG/ML IJ SOLN: 0.5000 mg | INTRAMUSCULAR | Status: DC | PRN

## 2023-04-25 MED ORDER — SODIUM CHLORIDE 0.9 % IV SOLN
2.0000 g | Freq: Once | INTRAVENOUS | Status: AC
  Administered 2023-04-25: 2 g via INTRAVENOUS
  Filled 2023-04-25: qty 20

## 2023-04-25 MED ORDER — LACTATED RINGERS IV SOLN: INTRAVENOUS | Status: DC

## 2023-04-25 MED ORDER — ACETAMINOPHEN 160 MG/5ML PO SOLN: 1000.0000 mg | Freq: Once | ORAL | Status: DC | PRN

## 2023-04-25 MED ORDER — PROPOFOL 10 MG/ML IV BOLUS
INTRAVENOUS | Status: AC
  Filled 2023-04-25: qty 20

## 2023-04-25 MED ORDER — CHLORHEXIDINE GLUCONATE 0.12 % MT SOLN: 15.0000 mL | Freq: Once | OROMUCOSAL | Status: DC

## 2023-04-25 MED ORDER — MIDAZOLAM HCL 2 MG/2ML IJ SOLN
INTRAMUSCULAR | Status: DC | PRN
  Administered 2023-04-25: 2 mg via INTRAVENOUS

## 2023-04-25 MED ORDER — DIPHENHYDRAMINE HCL 50 MG/ML IJ SOLN
25.0000 mg | Freq: Four times a day (QID) | INTRAMUSCULAR | Status: DC | PRN
Start: 2023-04-25 — End: 2023-04-27

## 2023-04-25 MED ORDER — DEXAMETHASONE SODIUM PHOSPHATE 10 MG/ML IJ SOLN
INTRAMUSCULAR | Status: DC | PRN
  Administered 2023-04-25: 4 mg via INTRAVENOUS

## 2023-04-25 MED ORDER — PANTOPRAZOLE SODIUM 40 MG PO TBEC
40.0000 mg | DELAYED_RELEASE_TABLET | Freq: Every day | ORAL | Status: DC
  Administered 2023-04-25 – 2023-04-27 (×3): 40 mg via ORAL
  Filled 2023-04-25 (×3): qty 1

## 2023-04-25 MED ORDER — DEXTROSE-SODIUM CHLORIDE 5-0.45 % IV SOLN: INTRAVENOUS | Status: AC

## 2023-04-25 MED ORDER — LACTATED RINGERS IV BOLUS
1000.0000 mL | Freq: Once | INTRAVENOUS | Status: AC
  Administered 2023-04-25: 1000 mL via INTRAVENOUS

## 2023-04-25 MED ORDER — DIPHENHYDRAMINE HCL 25 MG PO CAPS: 25.0000 mg | ORAL_CAPSULE | Freq: Four times a day (QID) | ORAL | Status: DC | PRN

## 2023-04-25 MED ORDER — GABAPENTIN 300 MG PO CAPS: 300.0000 mg | ORAL_CAPSULE | ORAL | Status: AC

## 2023-04-25 MED ORDER — ONDANSETRON HCL 4 MG/2ML IJ SOLN
4.0000 mg | Freq: Once | INTRAMUSCULAR | Status: AC
  Administered 2023-04-25: 4 mg via INTRAVENOUS
  Filled 2023-04-25: qty 2

## 2023-04-25 MED ORDER — METOPROLOL TARTRATE 5 MG/5ML IV SOLN: 5.0000 mg | Freq: Four times a day (QID) | INTRAVENOUS | Status: DC | PRN

## 2023-04-25 MED ORDER — ACETAMINOPHEN 500 MG PO TABS
1000.0000 mg | ORAL_TABLET | Freq: Four times a day (QID) | ORAL | Status: DC
Start: 2023-04-25 — End: 2023-04-27
  Administered 2023-04-25 – 2023-04-27 (×3): 1000 mg via ORAL
  Filled 2023-04-25 (×7): qty 2

## 2023-04-25 MED ORDER — MIDAZOLAM HCL 2 MG/2ML IJ SOLN
INTRAMUSCULAR | Status: AC
  Filled 2023-04-25: qty 2

## 2023-04-25 MED ORDER — BUPIVACAINE-EPINEPHRINE 0.25% -1:200000 IJ SOLN
INTRAMUSCULAR | Status: DC | PRN
  Administered 2023-04-25: 30 mL

## 2023-04-25 MED ORDER — OXYCODONE HCL 5 MG/5ML PO SOLN: 5.0000 mg | Freq: Once | ORAL | Status: DC | PRN

## 2023-04-25 MED ORDER — ONDANSETRON HCL 4 MG/2ML IJ SOLN: 4.0000 mg | Freq: Three times a day (TID) | INTRAMUSCULAR | Status: DC | PRN

## 2023-04-25 MED ORDER — BUPIVACAINE-EPINEPHRINE (PF) 0.25% -1:200000 IJ SOLN
INTRAMUSCULAR | Status: AC
  Filled 2023-04-25: qty 30

## 2023-04-25 MED ORDER — ORAL CARE MOUTH RINSE: 15.0000 mL | Freq: Once | OROMUCOSAL | Status: AC

## 2023-04-25 MED ORDER — OXYCODONE HCL 5 MG PO TABS: 5.0000 mg | ORAL_TABLET | Freq: Once | ORAL | Status: DC | PRN

## 2023-04-25 MED ORDER — CELECOXIB 200 MG PO CAPS: 200.0000 mg | ORAL_CAPSULE | ORAL | Status: AC

## 2023-04-25 MED ORDER — ACETAMINOPHEN 10 MG/ML IV SOLN: 1000.0000 mg | Freq: Once | INTRAVENOUS | Status: DC | PRN

## 2023-04-25 MED ORDER — FENTANYL CITRATE (PF) 250 MCG/5ML IJ SOLN
INTRAMUSCULAR | Status: DC | PRN
  Administered 2023-04-25 (×8): 50 ug via INTRAVENOUS

## 2023-04-25 MED ORDER — ALBUMIN HUMAN 5 % IV SOLN: INTRAVENOUS | Status: DC | PRN

## 2023-04-25 MED ORDER — HYDROMORPHONE HCL 1 MG/ML IJ SOLN
1.0000 mg | Freq: Once | INTRAMUSCULAR | Status: AC
  Administered 2023-04-25: 1 mg via INTRAVENOUS
  Filled 2023-04-25: qty 1

## 2023-04-25 MED ORDER — LACTATED RINGERS IV SOLN
INTRAVENOUS | Status: DC
Start: 2023-04-25 — End: 2023-04-25

## 2023-04-25 MED ORDER — METHOCARBAMOL 500 MG PO TABS: 500.0000 mg | ORAL_TABLET | Freq: Three times a day (TID) | ORAL | Status: DC | PRN

## 2023-04-25 MED ORDER — 0.9 % SODIUM CHLORIDE (POUR BTL) OPTIME
TOPICAL | Status: DC | PRN
  Administered 2023-04-25: 1000 mL

## 2023-04-25 MED ORDER — BISACODYL 5 MG PO TBEC: 5.0000 mg | DELAYED_RELEASE_TABLET | Freq: Every day | ORAL | Status: DC | PRN

## 2023-04-25 MED ORDER — ONDANSETRON HCL 4 MG/2ML IJ SOLN
INTRAMUSCULAR | Status: DC | PRN
  Administered 2023-04-25: 4 mg via INTRAVENOUS

## 2023-04-25 MED ORDER — ONDANSETRON HCL 4 MG/2ML IJ SOLN: 4.0000 mg | Freq: Four times a day (QID) | INTRAMUSCULAR | Status: DC | PRN

## 2023-04-25 MED ORDER — DOCUSATE SODIUM 100 MG PO CAPS
100.0000 mg | ORAL_CAPSULE | Freq: Two times a day (BID) | ORAL | Status: DC
  Administered 2023-04-25 – 2023-04-26 (×3): 100 mg via ORAL
  Filled 2023-04-25 (×3): qty 1

## 2023-04-25 MED ORDER — MELATONIN 3 MG PO TABS: 3.0000 mg | ORAL_TABLET | Freq: Every evening | ORAL | Status: DC | PRN

## 2023-04-25 MED ORDER — GABAPENTIN 300 MG PO CAPS
ORAL_CAPSULE | ORAL | Status: AC
  Administered 2023-04-25: 300 mg via ORAL
  Filled 2023-04-25: qty 1

## 2023-04-25 MED ORDER — ROCURONIUM BROMIDE 10 MG/ML (PF) SYRINGE
PREFILLED_SYRINGE | INTRAVENOUS | Status: DC | PRN
  Administered 2023-04-25: 20 mg via INTRAVENOUS
  Administered 2023-04-25: 80 mg via INTRAVENOUS

## 2023-04-25 MED ORDER — SODIUM CHLORIDE 0.9 % IR SOLN
Status: DC | PRN
  Administered 2023-04-25 (×2): 1000 mL

## 2023-04-25 MED ORDER — ACETAMINOPHEN 500 MG PO TABS: 1000.0000 mg | ORAL_TABLET | ORAL | Status: AC

## 2023-04-25 MED ORDER — FENTANYL CITRATE (PF) 100 MCG/2ML IJ SOLN
25.0000 ug | INTRAMUSCULAR | Status: DC | PRN
  Administered 2023-04-25: 50 ug via INTRAVENOUS

## 2023-04-25 MED ORDER — METHOCARBAMOL 1000 MG/10ML IJ SOLN: 500.0000 mg | Freq: Three times a day (TID) | INTRAMUSCULAR | Status: DC | PRN

## 2023-04-25 MED ORDER — OXYCODONE HCL 5 MG PO TABS
5.0000 mg | ORAL_TABLET | ORAL | Status: DC | PRN
Start: 2023-04-25 — End: 2023-04-27
  Administered 2023-04-25: 5 mg via ORAL
  Administered 2023-04-26: 10 mg via ORAL
  Filled 2023-04-25: qty 1
  Filled 2023-04-25 (×2): qty 2

## 2023-04-25 MED ORDER — ORAL CARE MOUTH RINSE: 15.0000 mL | Freq: Once | OROMUCOSAL | Status: DC

## 2023-04-25 MED ORDER — CELECOXIB 200 MG PO CAPS
ORAL_CAPSULE | ORAL | Status: AC
  Administered 2023-04-25: 200 mg via ORAL
  Filled 2023-04-25: qty 1

## 2023-04-25 MED ORDER — SIMETHICONE 80 MG PO CHEW: 40.0000 mg | CHEWABLE_TABLET | Freq: Four times a day (QID) | ORAL | Status: DC | PRN

## 2023-04-25 MED ORDER — POLYETHYLENE GLYCOL 3350 17 G PO PACK: 17.0000 g | PACK | Freq: Every day | ORAL | Status: DC | PRN

## 2023-04-25 MED ORDER — PROPOFOL 10 MG/ML IV BOLUS
INTRAVENOUS | Status: DC | PRN
  Administered 2023-04-25: 140 mg via INTRAVENOUS
  Administered 2023-04-25 (×2): 30 mg via INTRAVENOUS

## 2023-04-25 MED ORDER — HYDROMORPHONE HCL 1 MG/ML IJ SOLN
0.5000 mg | INTRAMUSCULAR | Status: DC | PRN
  Administered 2023-04-25 – 2023-04-26 (×3): 0.5 mg via INTRAVENOUS
  Filled 2023-04-25 (×4): qty 0.5

## 2023-04-25 MED ORDER — ONDANSETRON 4 MG PO TBDP
4.0000 mg | ORAL_TABLET | Freq: Once | ORAL | Status: AC
  Administered 2023-04-25: 4 mg via ORAL
  Filled 2023-04-25: qty 1

## 2023-04-25 MED ORDER — SUGAMMADEX SODIUM 200 MG/2ML IV SOLN
INTRAVENOUS | Status: DC | PRN
  Administered 2023-04-25: 200 mg via INTRAVENOUS

## 2023-04-25 MED ORDER — ACETAMINOPHEN 500 MG PO TABS
ORAL_TABLET | ORAL | Status: AC
  Administered 2023-04-25: 1000 mg via ORAL
  Filled 2023-04-25: qty 2

## 2023-04-25 MED ORDER — LIDOCAINE HCL (CARDIAC) PF 100 MG/5ML IV SOSY
PREFILLED_SYRINGE | INTRAVENOUS | Status: DC | PRN
  Administered 2023-04-25: 60 mg via INTRAVENOUS

## 2023-04-25 MED ORDER — SODIUM CHLORIDE 0.9 % IV SOLN
2.0000 g | INTRAVENOUS | Status: DC
  Administered 2023-04-26 – 2023-04-27 (×2): 2 g via INTRAVENOUS
  Filled 2023-04-25 (×2): qty 20

## 2023-04-25 MED ORDER — CHLORHEXIDINE GLUCONATE 0.12 % MT SOLN
15.0000 mL | Freq: Once | OROMUCOSAL | Status: AC
  Administered 2023-04-25: 15 mL via OROMUCOSAL

## 2023-04-25 MED ORDER — INDOCYANINE GREEN 25 MG IV SOLR
2.5000 mg | Freq: Once | INTRAVENOUS | Status: AC
  Administered 2023-04-25: 2.5 mg via INTRAVENOUS
  Filled 2023-04-25: qty 10

## 2023-04-25 SURGICAL SUPPLY — 49 items
APPLIER CLIP 5 13 M/L LIGAMAX5 (MISCELLANEOUS) ×1
BAG COUNTER SPONGE SURGICOUNT (BAG) ×1 IMPLANT
BLADE CLIPPER SURG (BLADE) IMPLANT
CANISTER SUCT 3000ML PPV (MISCELLANEOUS) ×1 IMPLANT
CHLORAPREP W/TINT 26 (MISCELLANEOUS) ×1 IMPLANT
CLIP APPLIE 5 13 M/L LIGAMAX5 (MISCELLANEOUS) IMPLANT
CLIP LIGATING HEMO O LOK GREEN (MISCELLANEOUS) ×1 IMPLANT
COVER SURGICAL LIGHT HANDLE (MISCELLANEOUS) ×1 IMPLANT
DERMABOND ADVANCED .7 DNX12 (GAUZE/BANDAGES/DRESSINGS) ×1 IMPLANT
ELECT REM PT RETURN 9FT ADLT (ELECTROSURGICAL) ×1
ELECTRODE REM PT RTRN 9FT ADLT (ELECTROSURGICAL) ×1 IMPLANT
GLOVE BIO SURGEON STRL SZ 6.5 (GLOVE) IMPLANT
GLOVE BIO SURGEON STRL SZ7 (GLOVE) IMPLANT
GLOVE BIOGEL PI IND STRL 6 (GLOVE) IMPLANT
GLOVE BIOGEL PI IND STRL 7.5 (GLOVE) IMPLANT
GLOVE BIOGEL PI MICRO STRL 6 (GLOVE) ×1 IMPLANT
GLOVE BIOGEL PI MICRO STRL 6.5 (GLOVE) IMPLANT
GLOVE INDICATOR 6.5 STRL GRN (GLOVE) ×1 IMPLANT
GLOVE INDICATOR 7.0 STRL GRN (GLOVE) IMPLANT
GOWN STRL REUS W/ TWL LRG LVL3 (GOWN DISPOSABLE) ×3 IMPLANT
GRASPER SUT TROCAR 14GX15 (MISCELLANEOUS) ×1 IMPLANT
IRRIG SUCT STRYKERFLOW 2 WTIP (MISCELLANEOUS) ×1
IRRIGATION SUCT STRKRFLW 2 WTP (MISCELLANEOUS) ×1 IMPLANT
KIT BASIN OR (CUSTOM PROCEDURE TRAY) ×1 IMPLANT
KIT IMAGING PINPOINTPAQ (MISCELLANEOUS) IMPLANT
KIT TURNOVER KIT B (KITS) ×1 IMPLANT
L-HOOK LAP DISP 36CM (ELECTROSURGICAL) ×1
LHOOK LAP DISP 36CM (ELECTROSURGICAL) ×1 IMPLANT
NDL INSUFFLATION 14GA 120MM (NEEDLE) ×1 IMPLANT
NEEDLE INSUFFLATION 14GA 120MM (NEEDLE) ×1 IMPLANT
NS IRRIG 1000ML POUR BTL (IV SOLUTION) ×1 IMPLANT
PAD ARMBOARD 7.5X6 YLW CONV (MISCELLANEOUS) ×1 IMPLANT
PENCIL BUTTON HOLSTER BLD 10FT (ELECTRODE) ×1 IMPLANT
SCISSORS LAP 5X35 DISP (ENDOMECHANICALS) ×1 IMPLANT
SET TUBE SMOKE EVAC HIGH FLOW (TUBING) ×1 IMPLANT
SLEEVE Z-THREAD 5X100MM (TROCAR) ×2 IMPLANT
SPECIMEN JAR SMALL (MISCELLANEOUS) ×1 IMPLANT
SUT MNCRL AB 4-0 PS2 18 (SUTURE) ×1 IMPLANT
SUT VIC AB 0 UR5 27 (SUTURE) IMPLANT
SYS BAG RETRIEVAL 10MM (BASKET) ×1
SYSTEM BAG RETRIEVAL 10MM (BASKET) ×1 IMPLANT
TOWEL GREEN STERILE (TOWEL DISPOSABLE) IMPLANT
TOWEL GREEN STERILE FF (TOWEL DISPOSABLE) ×1 IMPLANT
TRAY LAPAROSCOPIC MC (CUSTOM PROCEDURE TRAY) ×1 IMPLANT
TROCAR 11X100 Z THREAD (TROCAR) IMPLANT
TROCAR Z THREAD OPTICAL 12X100 (TROCAR) ×1 IMPLANT
TROCAR Z-THREAD OPTICAL 5X100M (TROCAR) ×1 IMPLANT
WARMER LAPAROSCOPE (MISCELLANEOUS) ×1 IMPLANT
WATER STERILE IRR 1000ML POUR (IV SOLUTION) ×1 IMPLANT

## 2023-04-25 NOTE — Anesthesia Preprocedure Evaluation (Signed)
Anesthesia Evaluation  Patient identified by MRN, date of birth, ID band Patient awake    Reviewed: Allergy & Precautions, NPO status , Patient's Chart, lab work & pertinent test results  History of Anesthesia Complications Negative for: history of anesthetic complications  Airway Mallampati: III  TM Distance: >3 FB Neck ROM: Full    Dental  (+) Teeth Intact, Dental Advisory Given   Pulmonary Recent URI  Flu Pos ~ 1 week ago   breath sounds clear to auscultation       Cardiovascular negative cardio ROS  Rhythm:Regular     Neuro/Psych negative neurological ROS  negative psych ROS   GI/Hepatic Neg liver ROS,,,Acute Cholecystitis   Endo/Other    Class 3 obesity  Renal/GU negative Renal ROS     Musculoskeletal negative musculoskeletal ROS (+)    Abdominal   Peds  Hematology negative hematology ROS (+) Lab Results      Component                Value               Date                      WBC                      17.1 (H)            04/24/2023                HGB                      15.8                04/24/2023                HCT                      48.1                04/24/2023                MCV                      81.9                04/24/2023                PLT                      480 (H)             04/24/2023              Anesthesia Other Findings   Reproductive/Obstetrics                             Anesthesia Physical Anesthesia Plan  ASA: 3  Anesthesia Plan: General   Post-op Pain Management: Tylenol PO (pre-op)*, Gabapentin PO (pre-op)* and Celebrex PO (pre-op)*   Induction:   PONV Risk Score and Plan: 2 and Ondansetron and Dexamethasone  Airway Management Planned: Oral ETT  Additional Equipment: None  Intra-op Plan:   Post-operative Plan: Extubation in OR  Informed Consent: I have reviewed the patients History and Physical, chart, labs and discussed the  procedure including the risks, benefits and alternatives for the proposed anesthesia with the  patient or authorized representative who has indicated his/her understanding and acceptance.     Dental advisory given  Plan Discussed with: CRNA  Anesthesia Plan Comments:        Anesthesia Quick Evaluation

## 2023-04-25 NOTE — ED Notes (Signed)
[place] PIV placed.
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
## 2023-04-25 NOTE — Progress Notes (Signed)
Progress Note  * Day of Surgery *  Subjective: Persistent RUQ pain and nausea  Objective: Vital signs in last 24 hours: Temp:  [97.8 F (36.6 C)-98.7 F (37.1 C)] 97.8 F (36.6 C) (01/28 0855) Pulse Rate:  [90-113] 100 (01/28 0855) Resp:  [16-18] 17 (01/28 0855) BP: (126-142)/(82-93) 127/86 (01/28 0855) SpO2:  [90 %-99 %] 98 % (01/28 0855) Weight:  [161 kg] 114 kg (01/27 1859)    Intake/Output from previous day: 01/27 0701 - 01/28 0700 In: 1100 [IV Piggyback:1100] Out: -  Intake/Output this shift: Total I/O In: 72 [I.V.:72] Out: -   PE: Gen: Myers, resting, NAD Abd: Soft, nondistended TTP in RUQ without rebound or guarding   Lab Results:  Recent Labs    04/23/23 0758 04/24/23 1934  WBC 13.4* 17.1*  HGB 15.6 15.8  HCT 46.0 48.1  PLT 401* 480*   BMET Recent Labs    04/23/23 0758 04/24/23 1934  NA 140 134*  K 4.2 3.3*  CL 100 96*  CO2 21* 23  GLUCOSE 100* 124*  BUN 13 16  CREATININE 0.89 0.98  CALCIUM 9.4 9.4   PT/INR No results for input(s): "LABPROT", "INR" in the last 72 hours. CMP     Component Value Date/Time   NA 134 (L) 04/24/2023 1934   K 3.3 (L) 04/24/2023 1934   CL 96 (L) 04/24/2023 1934   CO2 23 04/24/2023 1934   GLUCOSE 124 (H) 04/24/2023 1934   BUN 16 04/24/2023 1934   CREATININE 0.98 04/24/2023 1934   CALCIUM 9.4 04/24/2023 1934   PROT 8.6 (H) 04/24/2023 1934   ALBUMIN 4.3 04/24/2023 1934   AST 28 04/24/2023 1934   ALT 29 04/24/2023 1934   ALKPHOS 56 04/24/2023 1934   BILITOT 2.2 (H) 04/24/2023 1934   GFRNONAA >60 04/24/2023 1934   GFRAA >60 10/03/2019 0923   Lipase     Component Value Date/Time   LIPASE 22 04/24/2023 1934       Studies/Results: US Abdomen Limited RUQ (LIVER/GB) Result Date: 04/24/2023 CLINICAL DATA:  Right upper quadrant pain EXAM: ULTRASOUND ABDOMEN LIMITED RIGHT UPPER QUADRANT COMPARISON:  Abdominal ultrasound 04/23/2023. FINDINGS: Gallbladder: Gallstones are present measuring up to 1.4 cm.  There is no gallbladder wall thickening or pericholecystic fluid. No sonographic Murphy sign noted by sonographer. Common bile duct: Diameter: 2.5 mm Liver: No focal lesion identified. Within normal limits in parenchymal echogenicity. Portal vein is patent on color Doppler imaging with normal direction of blood flow towards the liver. Other: None. IMPRESSION: Cholelithiasis without sonographic evidence of acute cholecystitis. Electronically Signed   By: Darliss Cheney M.D.   On: 04/24/2023 22:51   US Abdomen Limited RUQ (LIVER/GB) Result Date: 04/23/2023 CLINICAL DATA:  One-week history of right upper quadrant pain EXAM: ULTRASOUND ABDOMEN LIMITED RIGHT UPPER QUADRANT COMPARISON:  Same day CT abdomen and pelvis FINDINGS: Gallbladder: No wall thickening visualized. Cholelithiasis measuring up to Gregory mm. No sonographic Murphy sign noted by sonographer. Common bile duct: Diameter: 4 mm Liver: No focal lesion identified. Within normal limits in parenchymal echogenicity. Portal vein is patent on color Doppler imaging with normal direction of blood flow towards the liver. Other: None. IMPRESSION: Cholelithiasis without sonographic evidence of acute cholecystitis. Electronically Signed   By: Agustin Cree M.D.   On: 04/23/2023 13:38   CT ABDOMEN PELVIS W CONTRAST Result Date: 04/23/2023 CLINICAL DATA:  Abdominal pain, acute, nonlocalized. EXAM: CT ABDOMEN AND PELVIS WITH CONTRAST TECHNIQUE: Multidetector CT imaging of the abdomen and pelvis was performed  using the standard protocol following bolus administration of intravenous contrast. RADIATION DOSE REDUCTION: This exam was performed according to the departmental dose-optimization program which includes automated exposure control, adjustment of the mA and/or kV according to patient size and/or use of iterative reconstruction technique. CONTRAST:  OMNIPAQUE IOHEXOL 300 MG/ML  SOLN COMPARISON:  CT scan abdomen and pelvis from 04/17/2023. FINDINGS: Lower chest: There  are new, patchy ground-glass opacities in the visualized bilateral lungs, nonspecific but commonly seen as a sequela of infection or inflammation. The lung bases are clear. No pleural effusion. The heart is normal in size. No pericardial effusion. Hepatobiliary: The liver is normal in size. Non-cirrhotic configuration. No suspicious mass. These is mild diffuse hepatic steatosis. No intrahepatic or extrahepatic bile duct dilation. Physiologically distended gallbladder. Redemonstration of tiny layering sub 5 mm calcified gallstones/sludge without imaging signs of acute cholecystitis. No abnormal wall thickening or pericholecystic free fluid. Redemonstration of a sub 5 mm calcification in the expected location of gallbladder neck/cystic duct region essentially similar to the prior study. Pancreas: Unremarkable. No pancreatic ductal dilatation or surrounding inflammatory changes. Spleen: Within normal limits. No focal lesion. Adrenals/Urinary Tract: Adrenal glands are unremarkable. No suspicious renal mass. No hydronephrosis. No renal or ureteric calculi. Urinary bladder is under distended, precluding optimal assessment. However, no large mass or stones identified. No perivesical fat stranding. Stomach/Bowel: No disproportionate dilation of the small or large bowel loops. No evidence of abnormal bowel wall thickening or inflammatory changes. The appendix is unremarkable. Vascular/Lymphatic: No ascites or pneumoperitoneum. No abdominal or pelvic lymphadenopathy, by size criteria. No aneurysmal dilation of the major abdominal arteries. Reproductive: Normal size prostate. Symmetric seminal vesicles. Other: The visualized soft tissues and abdominal wall are unremarkable. Musculoskeletal: No suspicious osseous lesions. IMPRESSION: 1. Since the prior study, there are new, patchy, ground-glass opacities in the visualized bilateral lungs, nonspecific but commonly seen as a sequela of infection or inflammation. Correlate  clinically. 2. Redemonstration of small volume layering calcified gallstones/sludge without imaging signs of acute cholecystitis. There is a persistent sub 5 mm calcification in the expected location of gallbladder neck/cystic duct, essentially similar to the prior study. 3. Multiple other nonacute observations, as described above. Electronically Signed   By: Jules Schick M.D.   On: 04/23/2023 11:38    Anti-infectives: Anti-infectives (From admission, onward)    Start     Dose/Rate Route Frequency Ordered Stop   04/26/23 0700  [MAR Hold]  cefTRIAXone (ROCEPHIN) 2 g in sodium chloride 0.9 % 100 mL IVPB        (MAR Hold since Tue 04/25/2023 at 0831.Hold Reason: Transfer to a Procedural area)   2 g 200 mL/hr over 30 Minutes Intravenous Every 24 hours 04/25/23 0654 05/03/23 0659   04/25/23 0400  cefTRIAXone (ROCEPHIN) 2 g in sodium chloride 0.9 % 100 mL IVPB        2 g 200 mL/hr over 30 Minutes Intravenous  Once 04/25/23 0353 04/25/23 0542        Assessment/Plan 33 yo Myers with cholelithiasis and concern for acute on chronic cholecystitis  LOS: 0 days   Plan for OR this AM for laparoscopic cholecystectomy with ICG  We discussed the etiology of patient's pain, we discussed treatment options and recommended surgery. We discussed details of surgery including general anesthesia, laparoscopic approach, identification of cystic duct and common bile duct. Ligation of cystic duct and cystic artery. Possible need for intraoperative cholangiogram, open procedure, and subtotal cholecystectomy. Possible risks of common bile duct injury, injury to surrounding  structures, bile leak, bleeding, infection, diarrhea, retained stone and hernia. The patient showed good understanding and all questions were answered   I reviewed ED provider notes, last 24 h vitals and pain scores, last 48 h intake and output, last 24 h labs and trends, and last 24 h imaging results.  This care required moderate level of medical  decision making.    Lysle Rubens, MD [facility] 04/25/2023, 9:29 AM Please see Amion for pager number during day hours 7:00am-4:30pm

## 2023-04-25 NOTE — Anesthesia Procedure Notes (Addendum)
Procedure Name: Intubation Date/Time: 04/25/2023 10:32 AM  Performed by: Alease Medina, CRNAPre-anesthesia Checklist: Patient identified, Emergency Drugs available, Suction available, Timeout performed and Patient being monitored Patient Re-evaluated:Patient Re-evaluated prior to induction Oxygen Delivery Method: Circle system utilized Preoxygenation: Pre-oxygenation with 100% oxygen Induction Type: IV induction Ventilation: Oral airway inserted - appropriate to patient size and Mask ventilation without difficulty Laryngoscope Size: Mac and 4 Grade View: Grade I Tube type: Oral Tube size: 7.5 mm Number of attempts: 1 Airway Equipment and Method: Stylet Placement Confirmation: ETT inserted through vocal cords under direct vision, positive ETCO2, CO2 detector and breath sounds checked- equal and bilateral Secured at: 24 cm Tube secured with: Tape Dental Injury: Teeth and Oropharynx as per pre-operative assessment

## 2023-04-25 NOTE — Transfer of Care (Signed)
Immediate Anesthesia Transfer of Care Note  Patient: Gregory Myers  Procedure(s) Performed: LAPAROSCOPIC CHOLECYSTECTOMY (Abdomen) INDOCYANINE GREEN FLUORESCENCE IMAGING (ICG) (Abdomen)  Patient Location: PACU  Anesthesia Type:General  Level of Consciousness: drowsy and patient cooperative  Airway & Oxygen Therapy: Patient Spontanous Breathing and Patient connected to nasal cannula oxygen  Post-op Assessment: Report given to RN, Post -op Vital signs reviewed and stable, and Patient moving all extremities X 4  Post vital signs: Reviewed and stable  Last Vitals:  Vitals Value Taken Time  BP 143/97 04/25/23 1256  Temp 36.7 C 04/25/23 1255  Pulse 99 04/25/23 1259  Resp 32 04/25/23 1300  SpO2 95 % 04/25/23 1259  Vitals shown include unfiled device data.  Last Pain:  Vitals:   04/25/23 1255  TempSrc:   PainSc: 0-No pain         Complications: No notable events documented.

## 2023-04-25 NOTE — Op Note (Signed)
04/24/2023 - 04/25/2023 12:45 PM  PATIENT: Gregory Myers  33 y.o. male  Patient Care Team: Royal Hawthorn, NP as PCP - General (Adult Health Nurse Practitioner)  PRE-OPERATIVE DIAGNOSIS: acute cholecystitis with cholelithiasis  POST-OPERATIVE DIAGNOSIS: same  PROCEDURE: laparoscopic cholecystectomy with lysis of adhesions  SURGEON: Donata Duff, MD  ASSISTANT: Marcille Blanco, MD  ANESTHESIA: General endotracheal  EBL: 15cc  DRAINS: None  SPECIMEN: Gallbladder  COUNTS: Sponge, needle and instrument counts were reported correct x2 at the conclusion of the operation  DISPOSITION: PACU in satisfactory condition  COMPLICATIONS: None  FINDINGS: Extensive midline adhesions, mostly omentum but some bowel. Was able to clear midline periumbilical port site from bowel and omentum. Critical view achieved. No ICG dye seen in duodenum concerning for choledocholithiasis.  DESCRIPTION:  The patient was identified & brought into the operating room. The was then positioned supine on the OR table. SCDs were in place and active during the entire case. He then underwent general endotracheal anesthesia. Pressure points were padded. Hair on the abdomen was clipped by the OR team. The abdomen was prepped and draped in the standard sterile fashion. Antibiotics were administered. A surgical timeout was performed and confirmed our plan.   A LUQ stab incision made and veress inserted, insufflated to .  Optiview attempted supraumbilically to left of midline away from prior ex lap incision and noted  adhesions as entered so elected to abort and cut down with Hasson. Unable to clear adhesions bluntly for Hasson insertion.  LUQ veress site exchanged for 5mm port with optiview. 2 additional RUQ and R flank ports placed to assist with lysis of adhesions. No injury noted from veress site or attempted port site at umbilicus. There was extensive adhesions and at this point I called in my partner to help  assist. We were able to clear periumbilical site from omentum and adhesions and Hasson port then placed.  The patient was then positioned in reverse Trendelenburg with slight left side down.  The liver and gallbladder were inspected. . The gallbladder fundus was grasped and elevated cephalad. An additional grasper was then placed on the infundibulum of the gallbladder and the infundibulum was retracted laterally. Staying high on the gallbladder, the peritoneum on both sides of the gallbladder was opened with hook cautery. Gentle blunt dissection was then employed with a Art gallery manager working down into [employer]. The cystic duct was identified and carefully circumferentially dissected. The cystic artery was also identified and carefully circumferentially dissected. The space between the cystic artery and hepatocystic plate was developed such that a good view of the liver could be seen through a window medial to the cystic artery. The triangle of Calot had been cleared of all fibrofatty tissue. At this point, a critical view of safety was achieved and the only structures visualized was the skeletonized cystic duct laterally, the skeletonized cystic artery and the liver through the window medial to the artery. No posterior cystic artery was noted  At this point we turned to ICG setting and there did not seem to be any dye noted in the duodenum concerning for choledocholithiasis.  Attention turned back to gallbladder. The cystic duct and artery were clipped with 2 hemolock clips on the patient side and 1 clip on the specimen side. The cystic duct and artery were then divided. The gallbladder was then freed from its remaining attachments to the liver using electrocautery and placed into an endocatch bag. The RUQ was gently irrigated with sterile saline. . During dissection  gallbladder was inadvertently violated with spillage of bile but no stone spillage noted. Gallbladder was then placed in endocatch  bag and removed from periumbilical port. Hemostasis was then verified requiring some electrocautery of fossa. The clips were in good position; the gallbladder fossa was then dry. We copiously irrigated with 2L of crystalloid in RUQ and LUQ where some bile had been noted to have migrated from spillage. The rest of the abdomen was inspected no injury nor bleeding elsewhere was identified. We reevaluated area of lysis and bowel appeared healthy and uninjuried.   The endocatch bag containing the gallbladder was then removed from the periumbilical port site and passed off as specimen. The periumbilical fascia was then closed using figure of 8 with 0 vicryl on UR 5. Ensured area cleared from any bowel or omentum. The RUQ  and LUQ ports were removed under direct visualization and noted to be hemostatic.Marland Kitchen The fascia was palpated and noted to be completely closed. The abdomen was then desufflated and the periumbilical trocar removed. The skin of all incision sites was approximated with 4-0 monocryl subcuticular suture and dermabond applied. The patient was then awakened from anesthesia, extubated, and transferred to a stretcher for transport to PACU in satisfactory condition.  Instrument, sponge, and needle counts were correct at closure and at the conclusion of the case.   Given ICG findings, will need post-op admission and GI consult for evaluation for ERCP.  Findings discussed with patient's mother and sister via phone. They understand he is to be admitted post-op.  Donata Duff, MD [facility]

## 2023-04-25 NOTE — ED Provider Notes (Signed)
[facility]-EMERGENCY DEPT [facility] Emergency Department Provider Note MRN:  [id number]  Arrival date & time: 04/25/23     Chief Complaint   Abdominal Pain   History of Present Illness   Gregory Myers is a 33 y.o. year-old male presents to the ED with chief complaint of RUQ abdominal pain with associated nausea and vomiting.  Recent admission for Flu A with GI symptoms.  Discharged on 1/25.  Came back to ER on 1/26 and diagnosed with gallstones.  General surgery consulted and recommended patient for cholecystectomy, but didn't have room on the schedule or lack of beds in hospital or some other capacity problem.  Patient returns tonight with worsening symptoms.  History provided by patient.   Review of Systems  Pertinent positive and negative review of systems noted in HPI.    Physical Exam   Vitals:   04/24/23 2205 04/25/23 0201  BP: 130/82 (!) 142/93  Pulse: (!) 110 (!) 113  Resp: 18 18  Temp: 98.2 F (36.8 C) 98.7 F (37.1 C)  SpO2: 95% 95%    CONSTITUTIONAL:  uncomfortable-appearing, NAD NEURO:  Alert and oriented x 3, CN 3-12 grossly intact EYES:  eyes equal and reactive ENT/NECK:  Supple, no stridor  CARDIO:  tachycardic, regular rhythm, appears well-perfused  PULM:  No respiratory distress, CTAB GI/GU:  non-distended, RUQ TTP MSK/SPINE:  No gross deformities, no edema, moves all extremities  SKIN:  no rash, atraumatic   *Additional and/or pertinent findings included in MDM below  Diagnostic and Interventional Summary    EKG Interpretation Date/Time:    Ventricular Rate:    PR Interval:    QRS Duration:    QT Interval:    QTC Calculation:   R Axis:      Text Interpretation:         Labs Reviewed  CBC WITH DIFFERENTIAL/PLATELET - Abnormal; Notable for the following components:      Result Value   WBC 17.1 (*)    RBC 5.87 (*)    Platelets 480 (*)    Neutro Abs 12.4 (*)    Monocytes Absolute 1.4 (*)    Abs Immature Granulocytes 0.13 (*)     All other components within normal limits  COMPREHENSIVE METABOLIC PANEL - Abnormal; Notable for the following components:   Sodium 134 (*)    Potassium 3.3 (*)    Chloride 96 (*)    Glucose, Bld 124 (*)    Total Protein 8.6 (*)    Total Bilirubin 2.2 (*)    All other components within normal limits  LIPASE, BLOOD    US Abdomen Limited RUQ (LIVER/GB)  Final Result      Medications  lactated ringers bolus 1,000 mL (has no administration in time range)  dextrose 5 % and 0.45 % NaCl infusion (has no administration in time range)  HYDROmorphone (DILAUDID) injection 0.5 mg (has no administration in time range)  ondansetron (ZOFRAN) injection 4 mg (has no administration in time range)  cefTRIAXone (ROCEPHIN) 2 g in sodium chloride 0.9 % 100 mL IVPB (has no administration in time range)  ondansetron (ZOFRAN-ODT) disintegrating tablet 4 mg (4 mg Oral Given 04/24/23 1944)  ondansetron (ZOFRAN-ODT) disintegrating tablet 4 mg (4 mg Oral Given 04/25/23 0224)  HYDROmorphone (DILAUDID) injection 1 mg (1 mg Intravenous Given 04/25/23 0359)  ondansetron (ZOFRAN) injection 4 mg (4 mg Intravenous Given 04/25/23 0358)     Procedures  /  Critical Care Procedures  ED Course and Medical Decision Making  I have  reviewed the triage vital signs, the nursing notes, and pertinent available records from the EMR.  Social Determinants Affecting Complexity of Care: Patient has no clinically significant social determinants affecting this chief complaint..   ED Course:    Medical Decision Making Patient here with RUQ abdominal pain, nausea, and vomiting.  Was supposed to have surgery today, but had to be rescheduled due to capacity.    Patient returns with worsening symptoms.  I discussed the case with Dr. Hillery Hunter, who advises that patient be admitted under [facility], MD at [facility] because [facility] is full.  Has me place temp orders.  Appreciate his help.   Risk Prescription drug management. Decision regarding  hospitalization.         Consultants: I consulted with Dr. Hillery Hunter, who recommends admission at [facility] for cholecystectomy.   Treatment and Plan: Patient's exam and diagnostic results are concerning for symptomatic cholelithiasis.  Feel that patient will need admission to the hospital for further treatment and evaluation.    Final Clinical Impressions(s) / ED Diagnoses     ICD-10-CM   1. Symptomatic cholelithiasis  K80.20       ED Discharge Orders     None         Discharge Instructions Discussed with and Provided to Patient:   Discharge Instructions   None      Roxy Horseman, PA-C 04/25/23 0403    Nira Conn, MD 04/25/23 [phone]

## 2023-04-25 NOTE — Plan of Care (Signed)
  Problem: Pain Managment: Goal: General experience of comfort will improve and/or be controlled Outcome: Progressing   Problem: Elimination: Goal: Will not experience complications related to urinary retention Outcome: Completed/Met

## 2023-04-26 ENCOUNTER — Inpatient Hospital Stay (HOSPITAL_COMMUNITY): Payer: BC Managed Care – PPO

## 2023-04-26 ENCOUNTER — Encounter (HOSPITAL_COMMUNITY): Payer: Self-pay | Admitting: General Surgery

## 2023-04-26 DIAGNOSIS — K8000 Calculus of gallbladder with acute cholecystitis without obstruction: Secondary | ICD-10-CM | POA: Diagnosis not present

## 2023-04-26 DIAGNOSIS — R7989 Other specified abnormal findings of blood chemistry: Secondary | ICD-10-CM

## 2023-04-26 LAB — CBC
HCT: 41.9 % (ref 39.0–52.0)
Hemoglobin: 13.8 g/dL (ref 13.0–17.0)
MCH: 26.7 pg (ref 26.0–34.0)
MCHC: 32.9 g/dL (ref 30.0–36.0)
MCV: 81 fL (ref 80.0–100.0)
Platelets: 460 10*3/uL — ABNORMAL HIGH (ref 150–400)
RBC: 5.17 MIL/uL (ref 4.22–5.81)
RDW: 13.3 % (ref 11.5–15.5)
WBC: 17.4 10*3/uL — ABNORMAL HIGH (ref 4.0–10.5)
nRBC: 0 % (ref 0.0–0.2)

## 2023-04-26 LAB — COMPREHENSIVE METABOLIC PANEL
ALT: 42 U/L (ref 0–44)
ALT: 44 U/L (ref 0–44)
AST: 34 U/L (ref 15–41)
AST: 37 U/L (ref 15–41)
Albumin: 3.4 g/dL — ABNORMAL LOW (ref 3.5–5.0)
Albumin: 3.9 g/dL (ref 3.5–5.0)
Alkaline Phosphatase: 48 U/L (ref 38–126)
Alkaline Phosphatase: 60 U/L (ref 38–126)
Anion gap: 15 (ref 5–15)
Anion gap: 15 (ref 5–15)
BUN: 11 mg/dL (ref 6–20)
BUN: 12 mg/dL (ref 6–20)
CO2: 26 mmol/L (ref 22–32)
CO2: 25 mmol/L (ref 22–32)
Calcium: 8.7 mg/dL — ABNORMAL LOW (ref 8.9–10.3)
Calcium: 9 mg/dL (ref 8.9–10.3)
Chloride: 96 mmol/L — ABNORMAL LOW (ref 98–111)
Chloride: 97 mmol/L — ABNORMAL LOW (ref 98–111)
Creatinine, Ser: 1.02 mg/dL (ref 0.61–1.24)
Creatinine, Ser: 1.03 mg/dL (ref 0.61–1.24)
GFR, Estimated: >60 mL/min (ref >60)
GFR, Estimated: >60 mL/min (ref >60)
Glucose, Bld: 96 mg/dL (ref 70–99)
Glucose, Bld: 85 mg/dL (ref 70–99)
Potassium: 3.2 mmol/L — ABNORMAL LOW (ref 3.5–5.1)
Potassium: 3.6 mmol/L (ref 3.5–5.1)
Sodium: 137 mmol/L (ref 135–145)
Sodium: 137 mmol/L (ref 135–145)
Total Bilirubin: 1.9 mg/dL — ABNORMAL HIGH (ref 0.0–1.2)
Total Bilirubin: 2 mg/dL — ABNORMAL HIGH (ref 0.0–1.2)
Total Protein: 7.1 g/dL (ref 6.5–8.1)
Total Protein: 8.2 g/dL — ABNORMAL HIGH (ref 6.5–8.1)

## 2023-04-26 LAB — SURGICAL PATHOLOGY

## 2023-04-26 LAB — BILIRUBIN, DIRECT: Bilirubin, Direct: 0.4 mg/dL — ABNORMAL HIGH (ref 0.0–0.2)

## 2023-04-26 NOTE — Anesthesia Postprocedure Evaluation (Signed)
Anesthesia Post Note  Patient: Gregory Myers  Procedure(s) Performed: LAPAROSCOPIC CHOLECYSTECTOMY (Abdomen) INDOCYANINE GREEN FLUORESCENCE IMAGING (ICG) (Abdomen)     Patient location during evaluation: PACU Anesthesia Type: General Level of consciousness: awake and alert Pain management: pain level controlled Vital Signs Assessment: post-procedure vital signs reviewed and stable Respiratory status: spontaneous breathing, nonlabored ventilation and respiratory function stable Cardiovascular status: blood pressure returned to baseline and stable Postop Assessment: no apparent nausea or vomiting Anesthetic complications: no   No notable events documented.               Doriann Zuch
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
## 2023-04-26 NOTE — Progress Notes (Signed)
1 Day Post-Op  Subjective: CC: Reports he is sore at his incisions and pain is worse with movement but otherwise does not have any abdominal pain and feels it is well controlled. Tolerated cld yesterday without worsening abdominal pain, n/v. NPO currently. LFT's pending at the time I saw him this am. Voiding without issues. Passing flatus. Mobilizing.   Objective: Vital signs in last 24 hours: Temp:  [97.8 F (36.6 C)-98.9 F (37.2 C)] 97.9 F (36.6 C) (01/29 0346) Pulse Rate:  [86-104] 97 (01/29 0347) Resp:  [17-28] 18 (01/29 0346) BP: (119-146)/(73-97) 145/80 (01/29 0346) SpO2:  [85 %-99 %] 91 % (01/29 0347) Last BM Date : 04/21/23  Intake/Output from previous day: 01/28 0701 - 01/29 0700 In: 1616.8 [I.V.:1366.8; IV Piggyback:250] Out: 890 [Urine:875; Blood:15] Intake/Output this shift: No intake/output data recorded.  PE: Gen:  Alert, NAD, pleasant Card:  RRR Pulm:  CTAB, no W/R/R, effort normal Abd: Soft, no distension, appropriately tender around laparoscopic incisions, no rigidity or guarding and otherwise NT, +BS. Incisions with glue intact appears well and are without drainage, bleeding, or signs of infection  Psych: A&Ox3   Lab Results:  Recent Labs    04/24/23 1934 04/26/23 0710  WBC 17.1* 17.4*  HGB 15.8 13.8  HCT 48.1 41.9  PLT 480* 460*   BMET Recent Labs    04/24/23 1934 04/25/23 1407  NA 134* 136  K 3.3* 3.5  CL 96* 97*  CO2 23 26  GLUCOSE 124* 144*  BUN 16 19  CREATININE 0.98 1.21  CALCIUM 9.4 9.0   PT/INR No results for input(s): "LABPROT", "INR" in the last 72 hours. CMP     Component Value Date/Time   NA 136 04/25/2023 1407   K 3.5 04/25/2023 1407   CL 97 (L) 04/25/2023 1407   CO2 26 04/25/2023 1407   GLUCOSE 144 (H) 04/25/2023 1407   BUN 19 04/25/2023 1407   CREATININE 1.21 04/25/2023 1407   CALCIUM 9.0 04/25/2023 1407   PROT 7.2 04/25/2023 1407   ALBUMIN 3.8 04/25/2023 1407   AST 42 (H) 04/25/2023 1407   ALT 42  04/25/2023 1407   ALKPHOS 51 04/25/2023 1407   BILITOT 1.8 (H) 04/25/2023 1407   GFRNONAA >60 04/25/2023 1407   GFRAA >60 10/03/2019 0923   Lipase     Component Value Date/Time   LIPASE 22 04/24/2023 1934    Studies/Results: US Abdomen Limited RUQ (LIVER/GB) Result Date: 04/24/2023 CLINICAL DATA:  Right upper quadrant pain EXAM: ULTRASOUND ABDOMEN LIMITED RIGHT UPPER QUADRANT COMPARISON:  Abdominal ultrasound 04/23/2023. FINDINGS: Gallbladder: Gallstones are present measuring up to 1.4 cm. There is no gallbladder wall thickening or pericholecystic fluid. No sonographic Murphy sign noted by sonographer. Common bile duct: Diameter: 2.5 mm Liver: No focal lesion identified. Within normal limits in parenchymal echogenicity. Portal vein is patent on color Doppler imaging with normal direction of blood flow towards the liver. Other: None. IMPRESSION: Cholelithiasis without sonographic evidence of acute cholecystitis. Electronically Signed   By: Darliss Cheney M.D.   On: 04/24/2023 22:51    Anti-infectives: Anti-infectives (From admission, onward)    Start     Dose/Rate Route Frequency Ordered Stop   04/26/23 0700  cefTRIAXone (ROCEPHIN) 2 g in sodium chloride 0.9 % 100 mL IVPB        2 g 200 mL/hr over 30 Minutes Intravenous Every 24 hours 04/25/23 0654 05/03/23 0659   04/25/23 0400  cefTRIAXone (ROCEPHIN) 2 g in sodium chloride 0.9 % 100 mL IVPB  2 g 200 mL/hr over 30 Minutes Intravenous  Once 04/25/23 0353 04/25/23 0542        Assessment/Plan POD 1 s/p laparoscopic cholecystectomy with lysis of adhesions by Dr. Azucena Cecil on 04/25/23 - No ICG dye seen in duodenum concerning for choledocholithiasis. GI consulted. No plans for MRCP or ERCP at this time. Recommended trending LFT's. T. Bili slightly up at 1.9 this am from 1.8. LFT's otherwise wnl. Will repeat LFT's again at noon to ensure they are downtrending. If worsening, may need to re-engage GI for recommendations.  - Mobilize, pulm  toilet.   FEN - NPO while awaiting repeat LFT's. If improving, will adv diet.  VTE - SCDs, Lovenox ID - Rocephin   LOS: 1 day    Gregory Myers , [facility] 04/26/2023, 8:19 AM Please see Amion for pager number during day hours 7:00am-4:30pm

## 2023-04-27 ENCOUNTER — Other Ambulatory Visit (HOSPITAL_COMMUNITY): Payer: Self-pay

## 2023-04-27 LAB — COMPREHENSIVE METABOLIC PANEL
ALT: 45 U/L — ABNORMAL HIGH (ref 0–44)
AST: 29 U/L (ref 15–41)
Albumin: 3.4 g/dL — ABNORMAL LOW (ref 3.5–5.0)
Alkaline Phosphatase: 60 U/L (ref 38–126)
Anion gap: 13 (ref 5–15)
BUN: 10 mg/dL (ref 6–20)
CO2: 26 mmol/L (ref 22–32)
Calcium: 8.6 mg/dL — ABNORMAL LOW (ref 8.9–10.3)
Chloride: 97 mmol/L — ABNORMAL LOW (ref 98–111)
Creatinine, Ser: 0.94 mg/dL (ref 0.61–1.24)
GFR, Estimated: >60 mL/min (ref >60)
Glucose, Bld: 87 mg/dL (ref 70–99)
Potassium: 3.1 mmol/L — ABNORMAL LOW (ref 3.5–5.1)
Sodium: 136 mmol/L (ref 135–145)
Total Bilirubin: 1.5 mg/dL — ABNORMAL HIGH (ref 0.0–1.2)
Total Protein: 6.8 g/dL (ref 6.5–8.1)

## 2023-04-27 LAB — BILIRUBIN, DIRECT: Bilirubin, Direct: 0.3 mg/dL — ABNORMAL HIGH (ref 0.0–0.2)

## 2023-04-27 MED ORDER — OXYCODONE HCL 5 MG PO TABS
5.0000 mg | ORAL_TABLET | Freq: Four times a day (QID) | ORAL | 0 refills | Status: AC | PRN
  Filled 2023-04-27: qty 15, 4d supply, fill #0

## 2023-04-27 MED ORDER — METHOCARBAMOL 500 MG PO TABS
500.0000 mg | ORAL_TABLET | Freq: Three times a day (TID) | ORAL | 0 refills | Status: AC | PRN
  Filled 2023-04-27: qty 30, 10d supply, fill #0

## 2023-04-27 MED ORDER — POLYETHYLENE GLYCOL 3350 17 G PO PACK: 17.0000 g | PACK | Freq: Every day | ORAL | Status: AC | PRN

## 2023-04-27 MED ORDER — ACETAMINOPHEN 500 MG PO TABS: 1000.0000 mg | ORAL_TABLET | Freq: Three times a day (TID) | ORAL | Status: AC | PRN

## 2023-04-27 MED ORDER — DOCUSATE SODIUM 100 MG PO CAPS: 100.0000 mg | ORAL_CAPSULE | Freq: Two times a day (BID) | ORAL | Status: AC | PRN

## 2023-04-27 NOTE — Discharge Instructions (Signed)
[facility]  LAPAROSCOPIC SURGERY: POST OP INSTRUCTIONS Always review your discharge instruction sheet given to you by the facility where your surgery was performed. IF YOU HAVE DISABILITY OR FAMILY LEAVE FORMS, YOU MUST BRING THEM TO THE OFFICE FOR PROCESSING.   DO NOT GIVE THEM TO YOUR DOCTOR.  PAIN CONTROL  Pain regimen: take over-the-counter tylenol (acetaminophen) 1000mg  every six hours, ibuprofen (600mg ) every six hours. With these, you should be taking something every three hours.  You also have a prescription for oxycodone, which should be taken if the tylenol (acetaminophen), ibuprofen, and robaxin (methocarbamol) are not enough to control your pain. You may take the oxycodone as frequently as every four hours as needed, but if you are taking the other medications as above, you should not need the oxycodone this frequently. Do not drive while taking or under the influence of the oxycodone as it is a narcotic medication. Use ice packs to help control pain. If you need a refill on your pain medication, please contact your pharmacy.  They will contact our office to request authorization. Prescriptions will not be filled after 5pm or on week-ends.  HOME MEDICATIONS Take your usually prescribed medications unless otherwise directed.  DIET You should follow a light diet the first few days after arrival home.  Be sure to include lots of fluids daily.   CONSTIPATION It is common to experience some constipation after surgery and if you are taking pain medication.  Increasing fluid intake and taking a stool softener (such as Colace) will usually help or prevent this problem from occurring.  A mild laxative (Miralax, over-the counter) should be taken according to package instructions if there are no bowel movements after 48 hours. If still no bowel movement 24 hours after taking Miralax, you may try magnesium citrate, available over the counter at a local pharmacy.    WOUND/INCISION CARE Most patients will experience some swelling and bruising in the area of the incisions.  Ice packs will help.  Swelling and bruising can take several days to resolve.  May shower beginning 1/30 Do not peel off or scrub skin glue. May allow warm soapy water to run over incision, then rinse and pat dry.  Do not soak in any water (tubs, hot tubs, pools, lakes, oceans) for one week.   ACTIVITIES You may resume regular (light) daily activities beginning the next day--such as daily self-care, walking, climbing stairs--gradually increasing activities as tolerated.  You may have sexual intercourse when it is comfortable.   No lifting greater than 5 pounds for six weeks.  You may drive when you are no longer taking narcotic pain medication, you can comfortably wear a seatbelt, and you can safely maneuver your car and apply brakes.  FOLLOW-UP You should see your doctor in the office for a follow-up appointment approximately 2-3 weeks after your surgery.  You should have been given your post-op/follow-up appointment when your surgery was scheduled.  If you did not receive a post-op/follow-up appointment, make sure that you call for this appointment within a day or two after you arrive home to ensure a convenient appointment time.  WHEN TO CALL YOUR DOCTOR: Fever over 101.5 Inability to urinate Continued bleeding from incision. Increased pain, redness, or drainage from the incision. Increasing abdominal pain  The clinic staff is available to answer your questions during regular business hours.  Please don't hesitate to call and ask to speak to one of the nurses for clinical concerns.  If you have a  medical emergency, go to the nearest emergency room or call 911.  A surgeon from [facility] is always on call at the hospital. [address], [place], [place]  [address] ? [address] [address], [place], [place]   [address] [phone] ? [phone] ? [phone]  [phone] Web site: [email or web address]
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
## 2023-04-27 NOTE — Plan of Care (Signed)
  Problem: Education: Goal: Knowledge of General Education information will improve Description: Including pain rating scale, medication(s)/side effects and non-pharmacologic comfort measures Outcome: Progressing   Problem: Health Behavior/Discharge Planning: Goal: Ability to manage health-related needs will improve Outcome: Progressing   Problem: Nutrition: Goal: Adequate nutrition will be maintained Outcome: Progressing   Problem: Pain Managment: Goal: General experience of comfort will improve and/or be controlled Outcome: Progressing

## 2023-04-27 NOTE — Progress Notes (Signed)
Discharge instructions (including medications) discussed with and copy provided to patient. Patient given opportunity to ask questions. Questions clarified.   Work note provided to pt.
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
## 2023-04-27 NOTE — Plan of Care (Signed)
  Problem: Education: Goal: Knowledge of General Education information will improve Description: Including pain rating scale, medication(s)/side effects and non-pharmacologic comfort measures Outcome: Adequate for Discharge   Problem: Health Behavior/Discharge Planning: Goal: Ability to manage health-related needs will improve Outcome: Adequate for Discharge   Problem: Clinical Measurements: Goal: Ability to maintain clinical measurements within normal limits will improve Outcome: Adequate for Discharge Goal: Will remain free from infection Outcome: Adequate for Discharge Goal: Diagnostic test results will improve Outcome: Adequate for Discharge Goal: Respiratory complications will improve Outcome: Adequate for Discharge Goal: Cardiovascular complication will be avoided Outcome: Adequate for Discharge   Problem: Activity: Goal: Risk for activity intolerance will decrease Outcome: Adequate for Discharge   Problem: Nutrition: Goal: Adequate nutrition will be maintained Outcome: Adequate for Discharge   Problem: Coping: Goal: Level of anxiety will decrease Outcome: Adequate for Discharge   Problem: Elimination: Goal: Will not experience complications related to bowel motility Outcome: Adequate for Discharge   Problem: Pain Managment: Goal: General experience of comfort will improve and/or be controlled Outcome: Adequate for Discharge   Problem: Safety: Goal: Ability to remain free from injury will improve Outcome: Adequate for Discharge   Problem: Skin Integrity: Goal: Risk for impaired skin integrity will decrease Outcome: Adequate for Discharge
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
## 2023-07-09 ENCOUNTER — Emergency Department (HOSPITAL_BASED_OUTPATIENT_CLINIC_OR_DEPARTMENT_OTHER)

## 2023-07-09 ENCOUNTER — Emergency Department (HOSPITAL_BASED_OUTPATIENT_CLINIC_OR_DEPARTMENT_OTHER)
Admission: EM | Admit: 2023-07-09 | Discharge: 2023-07-09 | Disposition: A | Discharge status: Home / Self Care | Attending: Emergency Medicine | Admitting: Emergency Medicine

## 2023-07-09 ENCOUNTER — Other Ambulatory Visit: Payer: Self-pay

## 2023-07-09 ENCOUNTER — Encounter (HOSPITAL_BASED_OUTPATIENT_CLINIC_OR_DEPARTMENT_OTHER): Payer: Self-pay | Admitting: Emergency Medicine

## 2023-07-09 DIAGNOSIS — M79645 Pain in left finger(s): Secondary | ICD-10-CM | POA: Diagnosis not present

## 2023-07-09 DIAGNOSIS — W108XXA Fall (on) (from) other stairs and steps, initial encounter: Secondary | ICD-10-CM | POA: Insufficient documentation

## 2023-07-09 DIAGNOSIS — S060X0A Concussion without loss of consciousness, initial encounter: Secondary | ICD-10-CM | POA: Insufficient documentation

## 2023-07-09 DIAGNOSIS — S0990XA Unspecified injury of head, initial encounter: Secondary | ICD-10-CM | POA: Diagnosis present

## 2023-07-09 MED ORDER — ACETAMINOPHEN 325 MG PO TABS
650.0000 mg | ORAL_TABLET | Freq: Once | ORAL | Status: AC
  Administered 2023-07-09: 650 mg via ORAL
  Filled 2023-07-09: qty 2

## 2023-07-09 MED ORDER — IBUPROFEN 400 MG PO TABS: 400.0000 mg | ORAL_TABLET | Freq: Three times a day (TID) | ORAL | 0 refills | Status: AC | PRN
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
## 2023-07-09 NOTE — ED Provider Notes (Signed)
 [place] [facility] Provider Note   CSN: [id number] Arrival date & time: 07/09/23  1223     History  Chief Complaint  Patient presents with   Gregory Myers is a 33 y.o. male reports himself is otherwise healthy no daily medication use presents for evaluation of headache following head injury that occurred around 10 days ago.  Patient reports that he slipped on some wet stairs and fell back and struck his head when he fell.  He reports that he had a headache that day that is mild-moderate and somewhat improved with Tylenol.  He reports that each day his headache will return, he reports it is worse in the morning and gradually improves with the day.  He reports his headache will worsen with bright light.  He localized the headache behind his eyes, is throbbing and aching.  Pain does not radiate.  He denies any associated neck pain, fevers/chills, numbness/weakness, blood thinner use, loss of consciousness, vomiting, vision loss.  Patient does report some left thumb pain from his fall as well but denies any additional concerns.  Patient reports left thumb pain is mild and aching only occurs whenever he tries to Hyperflex and pop his thumb.  Pain does not radiate it is improved with rest.  He denies any associated swelling or numbness.  HPI     Home Medications Prior to Admission medications   Medication Sig Start Date End Date Taking? Authorizing Provider  ibuprofen (ADVIL) 400 MG tablet Take 1 tablet (400 mg total) by mouth every 8 (eight) hours as needed for up to 5 days. 07/09/23 07/14/23 Yes Hadassah Letters A, PA-C  acetaminophen (TYLENOL) 500 MG tablet Take 2 tablets (1,000 mg total) by mouth every 8 (eight) hours as needed. 04/27/23   Maczis, Michael M, PA-C  docusate sodium (COLACE) 100 MG capsule Take 1 capsule (100 mg total) by mouth 2 (two) times daily as needed for mild constipation. 04/27/23   Maczis, Michael M, PA-C  methocarbamol  (ROBAXIN) 500 MG tablet Take 1 tablet (500 mg total) by mouth every 8 (eight) hours as needed for muscle spasms. 04/27/23   Maczis, Michael M, PA-C  ondansetron (ZOFRAN) 4 MG tablet Take 1 tablet (4 mg total) by mouth every 8 (eight) hours as needed for nausea or vomiting. 04/16/23   Tonya Fredrickson, MD  oseltamivir (TAMIFLU) 75 MG capsule Take 75 mg by mouth 2 (two) times daily.    [provider]  oxyCODONE (OXY IR/ROXICODONE) 5 MG immediate release tablet Take 1 tablet (5 mg total) by mouth every 6 (six) hours as needed for breakthrough pain. 04/27/23   Maczis, Michael M, PA-C  pantoprazole (PROTONIX) 40 MG tablet Take 1 tablet (40 mg total) by mouth daily. 04/22/23 06/21/23  Akula, Vijaya, MD  polyethylene glycol (MIRALAX / GLYCOLAX) 17 g packet Take 17 g by mouth daily as needed for mild constipation. 04/27/23   Maczis, Michael M, PA-C  promethazine (PHENERGAN) 25 MG tablet Take 1 tablet (25 mg total) by mouth every 6 (six) hours as needed for nausea or vomiting. 04/23/23   Lowery Rue, DO      Allergies    Amoxicillin and Pork-derived products    Review of Systems   Review of Systems Ten systems are reviewed and are negative for acute change except as noted in the HPI  Physical Exam Updated Vital Signs BP 119/84 (BP Location: Right Arm)   Pulse 84   Temp  98.2 F (36.8 C) (Oral)   Resp 18   Ht 5\' 5"  (1.651 m)   Wt 106.6 kg   SpO2 99%   BMI 39.11 kg/m  Physical Exam Constitutional:      General: He is not in acute distress.    Appearance: Normal appearance. He is well-developed. He is not ill-appearing or diaphoretic.  HENT:     Head: Normocephalic and atraumatic. No raccoon eyes, Battle's sign, abrasion or contusion.     Jaw: There is normal jaw occlusion.     Right Ear: Tympanic membrane and external ear normal. No hemotympanum.     Left Ear: Tympanic membrane and external ear normal. No hemotympanum.     Nose: Nose normal.     Mouth/Throat:     Lips: Pink.      Mouth: Mucous membranes are moist.     Pharynx: Oropharynx is clear. Uvula midline.  Eyes:     General: Vision grossly intact. Gaze aligned appropriately.     Extraocular Movements: Extraocular movements intact.     Conjunctiva/sclera: Conjunctivae normal.     Pupils: Pupils are equal, round, and reactive to light.     Slit lamp exam:    Right eye: Anterior chamber quiet.     Left eye: Anterior chamber quiet.     Visual Fields: Right eye visual fields normal and left eye visual fields normal.  Neck:     Trachea: Trachea and phonation normal.     Meningeal: Brudzinski's sign absent.  Cardiovascular:     Rate and Rhythm: Normal rate and regular rhythm.     Pulses:          Radial pulses are 2+ on the right side and 2+ on the left side.  Pulmonary:     Effort: Pulmonary effort is normal. No respiratory distress.     Breath sounds: Normal air entry.  Abdominal:     General: There is no distension.     Palpations: Abdomen is soft.     Tenderness: There is no abdominal tenderness. There is no guarding or rebound.  Musculoskeletal:        General: Normal range of motion.     Cervical back: Normal range of motion and neck supple. No spinous process tenderness or muscular tenderness.     Comments: Left hand: Patient points to the first metacarpal as his area of discomfort.  He has no tenderness of the distal femurs, IP joint or proximal phalanx of the thumb.  No tenderness of the proximal or distal radius.  No tenderness of the 2nd-5th digits, MCPs or metacarpals.  No tenderness to the ulnar side of the wrist.  No tenderness to the hypothenar eminence.  No tenderness of the distal ulna or TFCC.  No tenderness of the forearm.  Patient has full motion of the snuff hand, wrist, fingers and thumb with mild pain with endrange thumb flexion.  Capillary refill and sensation intact to all fingers.  Strong radial pulse.  Compartments soft.  Skin:    General: Skin is warm and dry.  Neurological:      Mental Status: He is alert.     GCS: GCS eye subscore is 4. GCS verbal subscore is 5. GCS motor subscore is 6.     Comments: Speech is clear and goal oriented, follows commands Major Cranial nerves without deficit, no facial droop Normal strength in upper and lower extremities bilaterally including dorsiflexion and plantar flexion, strong and equal grip strength Sensation normal to light and  sharp touch Moves extremities without ataxia, coordination intact Normal finger to nose and rapid alternating movements Neg romberg, no pronator drift Normal gait Normal heel-shin and balance   Psychiatric:        Behavior: Behavior normal.     ED Results / Procedures / Treatments   Labs (all labs ordered are listed, but only abnormal results are displayed) Labs Reviewed - No data to display  EKG None  Radiology DG Hand Complete Left Result Date: 07/09/2023 CLINICAL DATA:  Fall. EXAM: LEFT HAND - COMPLETE 3+ VIEW COMPARISON:  None Available. FINDINGS: No evidence of fracture of the carpal or metacarpal bones. Radiocarpal joint is intact. Phalanges are normal. No soft tissue injury. IMPRESSION: No fracture or dislocation. Electronically Signed   By: Deboraha Fallow M.D.   On: 07/09/2023 14:21   CT Head Wo Contrast Result Date: 07/09/2023 CLINICAL DATA:  Fall with head injury and worsening pain. EXAM: CT HEAD WITHOUT CONTRAST TECHNIQUE: Contiguous axial images were obtained from the base of the skull through the vertex without intravenous contrast. RADIATION DOSE REDUCTION: This exam was performed according to the departmental dose-optimization program which includes automated exposure control, adjustment of the mA and/or kV according to patient size and/or use of iterative reconstruction technique. COMPARISON:  03/04/2019 FINDINGS: Brain: There is no evidence for acute hemorrhage, hydrocephalus, mass lesion, or abnormal extra-axial fluid collection. No definite CT evidence for acute infarction.  Vascular: No hyperdense vessel or unexpected calcification. Skull: No evidence for fracture. No worrisome lytic or sclerotic lesion. Sinuses/Orbits: The visualized paranasal sinuses and mastoid air cells are clear. Visualized portions of the globes and intraorbital fat are unremarkable. Other: None. IMPRESSION: No acute intracranial abnormality. Electronically Signed   By: Donnal Fusi M.D.   On: 07/09/2023 13:35    Procedures Procedures    Medications Ordered in ED Medications  acetaminophen (TYLENOL) tablet 650 mg (650 mg Oral Given 07/09/23 1344)    ED Course/ Medical Decision Making/ A&P                                 Medical Decision Making 33 year old male presents for 2 complaints today.  1.  Primary complaint headache after head injury 10 days ago.  Differential includes but not limited to epidural hemorrhage, subdural hemorrhage, subarachnoid hemorrhage, intracranial mass, concussion.  2.  Secondary complaint is left thumb pain after fall 10 days ago.  Differential includes but not limited to fracture, dislocation, septic joint, cellulitis.   Amount and/or Complexity of Data Reviewed Radiology: ordered.    Details: Personally reviewed and interpreted three-view radiograph patient's left hand today.  I do not appreciate any obvious acute displaced fracture, dislocation or radiopaque foreign bodies.   I personally reviewed and interpreted patient's head CT.  I do not appreciate any obvious intracranial hemorrhage or mass.  Risk OTC drugs. Prescription drug management. Risk Details: 1.  Headache after head injury 10 days ago.  Suspect patient is suffering from concussion today.  He has a reassuring neurologic examination today.  Patient plans to use ibuprofen and Tylenol to help manage symptoms.  We discussed need to avoid further head injuries or excessive screen time to help with concussion symptoms.  I encourage patient to follow-up with his primary care provider or sports  medicine provider for recheck.  Strict ER return precautions were given.  I have a low suspicion for meningitis, intracranial bleed or other emergent pathologies at this time.  Case discussed with Dr. Colonel Dears who agrees with plan.  2.  Left thumb pain: Suspect due to contusive injury and sprain.  Radiographs today are reassuring.  Patient placed in thumb spica brace for protection of the scaphoid and wrist.  Patient advised to avoid exacerbation activities.  Encouraged RICE therapy along with ibuprofen and Tylenol.  Encouraged to recheck with hand specialist in 10 days for repeat radiographs if symptoms do not resolve.  Doubt cellulitis, septic joint, fracture/dislocation, neurovascular compromise or other emergent pathology at this time.       At this time there does not appear to be any evidence of an acute emergency medical condition and the patient appears stable for discharge with appropriate outpatient follow up. Diagnosis was discussed with patient who verbalizes understanding of care plan and is agreeable to discharge. I have discussed return precautions with patient  who verbalizes understanding. Patient encouraged to follow-up with their PCP and hand specialist. All questions answered.  Patient's case discussed with Dr. Colonel Dears who agrees with plan to discharge with follow-up.   Note: Portions of this report may have been transcribed using voice recognition software. Every effort was made to ensure accuracy; however, inadvertent computerized transcription errors may still be present.         Final Clinical Impression(s) / ED Diagnoses Final diagnoses:  Concussion without loss of consciousness, initial encounter  Thumb pain, left    Rx / DC Orders ED Discharge Orders          Ordered    ibuprofen (ADVIL) 400 MG tablet  Every 8 hours PRN        07/09/23 1444              Jennifer Moellers, PA-C 07/09/23 1538    Lowery Rue, DO 07/10/23 0801

## 2023-07-09 NOTE — ED Triage Notes (Signed)
 Pt fell down the steps one week ago.  Pt hit his head.  No LOC.  Pt continues to have headache and some blurry vision since his fall.  No vomiting or confusion
